# Patient Record
Sex: Female | Born: 1950 | Race: White | Hispanic: No | Marital: Married | Smoking: Never smoker
Health system: Southern US, Community
[De-identification: ages and names within clinical notes are randomized; demographics above are authoritative.]

## PROBLEM LIST (undated history)

## (undated) DIAGNOSIS — N6019 Diffuse cystic mastopathy of unspecified breast: Secondary | ICD-10-CM

## (undated) DIAGNOSIS — M8580 Other specified disorders of bone density and structure, unspecified site: Secondary | ICD-10-CM

## (undated) DIAGNOSIS — G43909 Migraine, unspecified, not intractable, without status migrainosus: Secondary | ICD-10-CM

## (undated) DIAGNOSIS — D473 Essential (hemorrhagic) thrombocythemia: Secondary | ICD-10-CM

## (undated) DIAGNOSIS — D219 Benign neoplasm of connective and other soft tissue, unspecified: Secondary | ICD-10-CM

## (undated) DIAGNOSIS — I10 Essential (primary) hypertension: Secondary | ICD-10-CM

## (undated) HISTORY — DX: Other specified disorders of bone density and structure, unspecified site: M85.80

## (undated) HISTORY — DX: Migraine, unspecified, not intractable, without status migrainosus: G43.909

## (undated) HISTORY — DX: Diffuse cystic mastopathy of unspecified breast: N60.19

## (undated) HISTORY — DX: Essential (primary) hypertension: I10

## (undated) HISTORY — PX: BREAST SURGERY: SHX581

## (undated) HISTORY — DX: Benign neoplasm of connective and other soft tissue, unspecified: D21.9

## (undated) HISTORY — DX: Essential (hemorrhagic) thrombocythemia: D47.3

---

## 1998-03-22 ENCOUNTER — Ambulatory Visit (HOSPITAL_COMMUNITY): Admission: RE | Admit: 1998-03-22 | Discharge: 1998-03-22 | Payer: Self-pay | Admitting: Gastroenterology

## 2000-06-06 ENCOUNTER — Other Ambulatory Visit: Admission: RE | Admit: 2000-06-06 | Discharge: 2000-06-06 | Payer: Self-pay

## 2000-07-30 ENCOUNTER — Other Ambulatory Visit: Admission: RE | Admit: 2000-07-30 | Discharge: 2000-07-30 | Payer: Self-pay | Admitting: General Surgery

## 2000-11-24 ENCOUNTER — Encounter: Payer: Self-pay | Admitting: General Surgery

## 2000-11-24 ENCOUNTER — Encounter: Admission: RE | Admit: 2000-11-24 | Discharge: 2000-11-24 | Payer: Self-pay | Admitting: General Surgery

## 2000-11-25 ENCOUNTER — Encounter (INDEPENDENT_AMBULATORY_CARE_PROVIDER_SITE_OTHER): Payer: Self-pay | Admitting: Specialist

## 2000-11-25 ENCOUNTER — Ambulatory Visit (HOSPITAL_BASED_OUTPATIENT_CLINIC_OR_DEPARTMENT_OTHER): Admission: RE | Admit: 2000-11-25 | Discharge: 2000-11-25 | Payer: Self-pay | Admitting: General Surgery

## 2003-01-20 ENCOUNTER — Other Ambulatory Visit: Admission: RE | Admit: 2003-01-20 | Discharge: 2003-01-20 | Payer: Self-pay | Admitting: Radiology

## 2003-06-16 ENCOUNTER — Other Ambulatory Visit: Admission: RE | Admit: 2003-06-16 | Discharge: 2003-06-16 | Payer: Self-pay | Admitting: Obstetrics and Gynecology

## 2003-11-02 ENCOUNTER — Ambulatory Visit (HOSPITAL_COMMUNITY): Admission: RE | Admit: 2003-11-02 | Discharge: 2003-11-02 | Payer: Self-pay | Admitting: Gastroenterology

## 2004-11-07 ENCOUNTER — Other Ambulatory Visit: Admission: RE | Admit: 2004-11-07 | Discharge: 2004-11-07 | Payer: Self-pay | Admitting: Internal Medicine

## 2007-02-16 ENCOUNTER — Other Ambulatory Visit: Admission: RE | Admit: 2007-02-16 | Discharge: 2007-02-16 | Payer: Self-pay | Admitting: Internal Medicine

## 2009-02-27 ENCOUNTER — Other Ambulatory Visit: Admission: RE | Admit: 2009-02-27 | Discharge: 2009-02-27 | Payer: Self-pay | Admitting: Internal Medicine

## 2010-03-02 ENCOUNTER — Ambulatory Visit: Payer: Self-pay | Admitting: Oncology

## 2010-03-06 LAB — COMPREHENSIVE METABOLIC PANEL
ALT: 22 U/L (ref 0–35)
AST: 25 U/L (ref 0–37)
Albumin: 4.7 g/dL (ref 3.5–5.2)
Alkaline Phosphatase: 60 U/L (ref 39–117)
BUN: 20 mg/dL (ref 6–23)
CO2: 24 mEq/L (ref 19–32)
Calcium: 9.8 mg/dL (ref 8.4–10.5)
Chloride: 103 mEq/L (ref 96–112)
Creatinine, Ser: 0.97 mg/dL (ref 0.40–1.20)
Glucose, Bld: 98 mg/dL (ref 70–99)
Potassium: 4.7 mEq/L (ref 3.5–5.3)
Sodium: 141 mEq/L (ref 135–145)
Total Bilirubin: 0.5 mg/dL (ref 0.3–1.2)
Total Protein: 7.1 g/dL (ref 6.0–8.3)

## 2010-03-06 LAB — CBC WITH DIFFERENTIAL/PLATELET
BASO%: 0.4 % (ref 0.0–2.0)
Basophils Absolute: 0 10*3/uL (ref 0.0–0.1)
EOS%: 1.7 % (ref 0.0–7.0)
Eosinophils Absolute: 0.1 10*3/uL (ref 0.0–0.5)
HCT: 39.6 % (ref 34.8–46.6)
HGB: 13.8 g/dL (ref 11.6–15.9)
LYMPH%: 22.4 % (ref 14.0–49.7)
MCH: 32.2 pg (ref 25.1–34.0)
MCHC: 34.9 g/dL (ref 31.5–36.0)
MCV: 92.3 fL (ref 79.5–101.0)
MONO#: 0.4 10*3/uL (ref 0.1–0.9)
MONO%: 9.2 % (ref 0.0–14.0)
NEUT#: 3.2 10*3/uL (ref 1.5–6.5)
NEUT%: 66.3 % (ref 38.4–76.8)
Platelets: 99 10*3/uL — ABNORMAL LOW (ref 145–400)
RBC: 4.29 10*6/uL (ref 3.70–5.45)
RDW: 12.8 % (ref 11.2–14.5)
WBC: 4.8 10*3/uL (ref 3.9–10.3)
lymph#: 1.1 10*3/uL (ref 0.9–3.3)

## 2010-03-06 LAB — CHCC SMEAR

## 2010-03-06 LAB — LACTATE DEHYDROGENASE: LDH: 193 U/L (ref 94–250)

## 2010-03-08 ENCOUNTER — Ambulatory Visit (HOSPITAL_COMMUNITY): Admission: RE | Admit: 2010-03-08 | Discharge: 2010-03-08 | Payer: Self-pay | Admitting: Oncology

## 2010-07-10 ENCOUNTER — Ambulatory Visit: Payer: Self-pay | Admitting: Oncology

## 2010-07-12 LAB — CBC WITH DIFFERENTIAL/PLATELET
BASO%: 0.6 % (ref 0.0–2.0)
Basophils Absolute: 0 10*3/uL (ref 0.0–0.1)
EOS%: 3.9 % (ref 0.0–7.0)
Eosinophils Absolute: 0.2 10*3/uL (ref 0.0–0.5)
HCT: 36.5 % (ref 34.8–46.6)
HGB: 12.6 g/dL (ref 11.6–15.9)
LYMPH%: 23.4 % (ref 14.0–49.7)
MCH: 32 pg (ref 25.1–34.0)
MCHC: 34.6 g/dL (ref 31.5–36.0)
MCV: 92.2 fL (ref 79.5–101.0)
MONO#: 0.5 10*3/uL (ref 0.1–0.9)
MONO%: 9.9 % (ref 0.0–14.0)
NEUT#: 3.1 10*3/uL (ref 1.5–6.5)
NEUT%: 62.2 % (ref 38.4–76.8)
Platelets: 114 10*3/uL — ABNORMAL LOW (ref 145–400)
RBC: 3.96 10*6/uL (ref 3.70–5.45)
RDW: 12.6 % (ref 11.2–14.5)
WBC: 4.9 10*3/uL (ref 3.9–10.3)
lymph#: 1.2 10*3/uL (ref 0.9–3.3)

## 2011-01-10 ENCOUNTER — Encounter (HOSPITAL_BASED_OUTPATIENT_CLINIC_OR_DEPARTMENT_OTHER): Payer: BC Managed Care – PPO | Admitting: Oncology

## 2011-01-10 ENCOUNTER — Other Ambulatory Visit: Payer: Self-pay | Admitting: Oncology

## 2011-01-10 DIAGNOSIS — D696 Thrombocytopenia, unspecified: Secondary | ICD-10-CM

## 2011-01-10 DIAGNOSIS — D6959 Other secondary thrombocytopenia: Secondary | ICD-10-CM

## 2011-01-10 LAB — CBC WITH DIFFERENTIAL/PLATELET
BASO%: 0.4 % (ref 0.0–2.0)
Basophils Absolute: 0 10*3/uL (ref 0.0–0.1)
EOS%: 2.1 % (ref 0.0–7.0)
Eosinophils Absolute: 0.1 10*3/uL (ref 0.0–0.5)
HCT: 38.7 % (ref 34.8–46.6)
HGB: 13 g/dL (ref 11.6–15.9)
LYMPH%: 23.4 % (ref 14.0–49.7)
MCH: 31.2 pg (ref 25.1–34.0)
MCHC: 33.7 g/dL (ref 31.5–36.0)
MCV: 92.7 fL (ref 79.5–101.0)
MONO#: 0.4 10*3/uL (ref 0.1–0.9)
MONO%: 8.2 % (ref 0.0–14.0)
NEUT#: 3.5 10*3/uL (ref 1.5–6.5)
NEUT%: 65.9 % (ref 38.4–76.8)
Platelets: 125 10*3/uL — ABNORMAL LOW (ref 145–400)
RBC: 4.17 10*6/uL (ref 3.70–5.45)
RDW: 12.4 % (ref 11.2–14.5)
WBC: 5.4 10*3/uL (ref 3.9–10.3)
lymph#: 1.3 10*3/uL (ref 0.9–3.3)

## 2011-02-01 NOTE — Op Note (Signed)
[name], Cynthia Lin                        ACCOUNT NO.:  [id number]   MEDICAL RECORD NO.:  [id number]                   PATIENT TYPE:  AMB   LOCATION:  ENDO                                 FACILITY:  [facility]   PHYSICIAN:  Anselmo Rod, M.D.               DATE OF BIRTH:  [birth year]   DATE OF PROCEDURE:  11/02/2003  DATE OF DISCHARGE:                                 OPERATIVE REPORT   PROCEDURE PERFORMED:  Screening colonoscopy.   ENDOSCOPIST:  Charna Elizabeth, M.D.   INSTRUMENT USED:  Olympus video colonoscope.   INDICATIONS FOR PROCEDURE:  The patient is a 60 year old white female  undergoing screening colonoscopy for a family history of colon cancer.  Rule  out colonic polyps, masses, etc.   PREPROCEDURE PREPARATION:  Informed consent was procured from the patient.  The patient was fasted for eight hours prior to the procedure and prepped  with a bottle of magnesium citrate and a gallon of GoLYTELY the night prior  to the procedure.   PREPROCEDURE PHYSICAL:  The patient had stable vital signs.  Neck supple.  Chest clear to auscultation.  S1 and S2 regular.  Abdomen soft with normal  bowel sounds.   DESCRIPTION OF PROCEDURE:  The patient was placed in left lateral decubitus  position and sedated with 25 mg of Demerol and 2.5 mg of Versed  intravenously.  Once the patient was adequately sedated and maintained on  low flow oxygen and continuous cardiac monitoring, the Olympus video  colonoscope was advanced from the rectum to the cecum.  The appendicular  orifice and ileocecal valve were clearly visualized and photographed.  No  masses, polyps, erosions, ulcerations or diverticula were seen.  Retroflexion in the rectum revealed small nonbleeding internal hemorrhoids.  The patient tolerated the procedure well without complications.   IMPRESSION:  Essentially normal colonoscopy up to the cecum except for small  internal hemorrhoids.   RECOMMENDATIONS:  1. Repeat colorectal  cancer screening is recommended in the next five years     unless the patient develops any     abnormal symptoms in the interim.  2. Outpatient followup as need arises in the future.  3. Continue high fiber diet with liberal fluid intake.                                               Anselmo Rod, M.D.    JNM/MEDQ  D:  11/02/2003  T:  11/02/2003  Job:  [id number]   cc:   Lovenia Kim, D.O.  [address]  [place]  [place] [address]  Fax: [phone]

## 2011-02-01 NOTE — Op Note (Signed)
[facility]  Patient:    Cynthia Lin, Cynthia Lin                     MRN: [id number] Proc. Date: 11/25/00 Adm. Date:  [id number] Attending:  Arlis Porta CC:         Ammie Dalton, M.D.                           Operative Report  PREOPERATIVE DIAGNOSIS:  Left breast mass.  POSTOPERATIVE DIAGNOSIS:  Left breast mass.  OPERATION PERFORMED:  Left breast biopsy.  SURGEON:  Adolph Pollack, M.D.  ASSISTANT:  Gwenlyn Perking, M.D.  ANESTHESIA:  General.  INDICATIONS FOR PROCEDURE:  The patient is a 60 year old female whom we first saw July 30, 2000.  At this time she had been treated for mastitis.  She was noted to have numerous dilated ducts without any intraductal masses noted on ultrasound.  She also had some bloody nipple discharge.  When I first saw her, she appeared to have some residual mastitis.  She also had a palpable irregular breast mass in the upper outer quadrant and when pressure was placedon this mass, bloody nipple discharge was noted.  She then underwent a ductogram which demonstrated some dilated ducts but did not demonstrate any filling defects of findings consistent with an intraductal papilloma or mass. After the ductogram had been performed, she came back for follow-up.  We had talked about biopsy or close follow-up and had concurred on the latter plan. After close follow-up she had the persistent left breast mass and I advised her to undergo biopsy and she presents for that.  The procedure and risks including but not limited to bleeding and infection were explained to her preoperatively.  DESCRIPTION OF PROCEDURE:  She was placed supine on the operating table.  The left breast mass in the upper outer quadrant was palpated and marked.  She was then given intravenous sedation.  The left breast was sterilely prepped and draped.  Local anesthetic was infiltrated both superficially and deep in a curvilinear  fashion from approximately the 12 oclock position to the 3 oclock position.  A curvilinear incision was made in the upper outer quadrant of the left breast incising the skin sharply.  Skin flaps were raised in all directions.  I could palpate the irregularity and it was excised sharply.  As I was excising it, there  were multiple pockets of cloudy fluid.  I suspected these were the ectatic ducts that the ultrasound mentioned.  I marked the lesion with suture.  Specifically, one suture for the superior margin and two sutures for the medial margin.  It was then sent fresh to pathology.  I then inspected the biopsy bed and there were bleeding points and these were controlled with the cautery.  Once the hemostasis was adequate, I loosely approximated the subcutaneous tissue with interrupted 3-0 Vicryl suture and closed the skin with running 4-0 Monocryl subcuticular stitch.  Steri-Strip and sterile dressing were applied.  The patient tolerated the procedure well without any apparent complications and she was taken to the recovery room in satisfactory condition. DD:  11/25/00 TD:  11/25/00 Job: [id number]

## 2011-06-26 IMAGING — US US ABDOMEN LIMITED
1 series · 11 of 11 positions shown · non-contrast
Comparison: None

CLINICAL DATA: Evaluate spleen size.

LIMITED ABDOMINAL ULTRASOUND

[Series 1: us abdomen limited · 0.23mm/px · 11 of 11 slices shown]
[im 1/11]
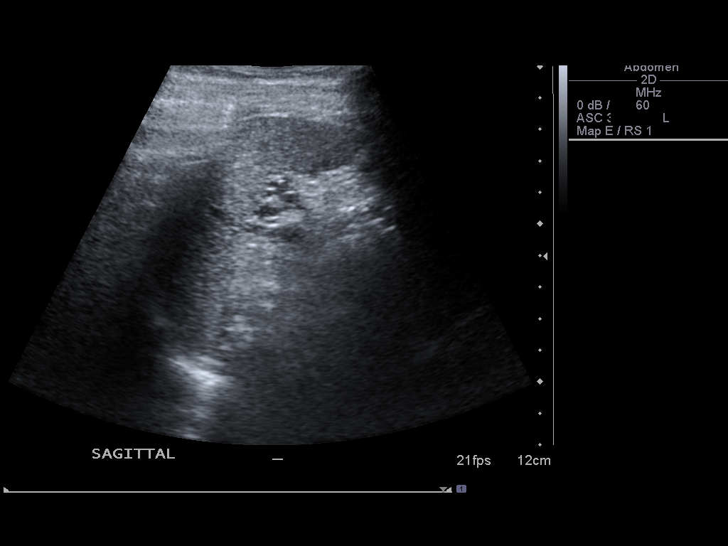
[im 2/11]
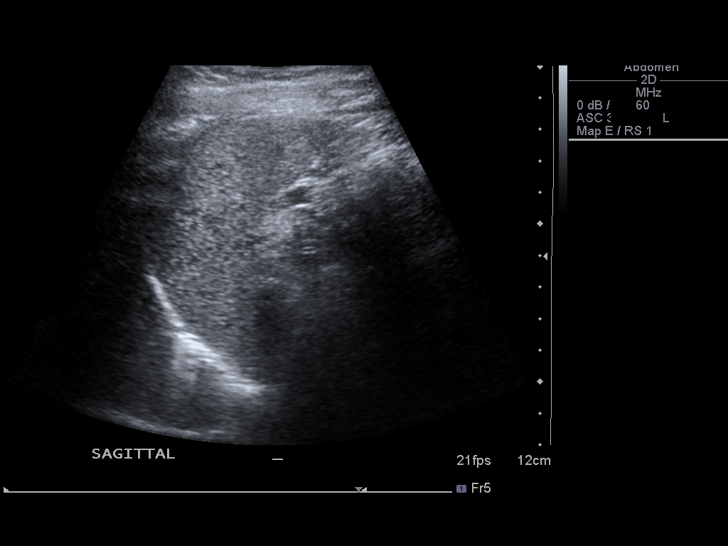
[im 3/11]
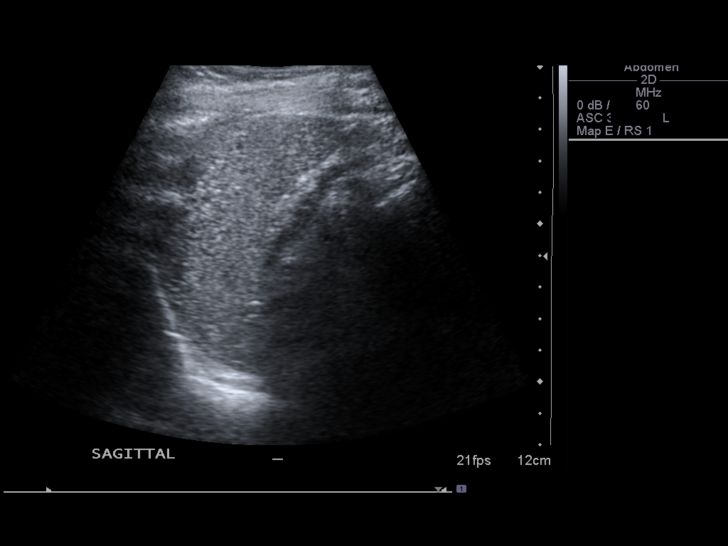
[im 4/11]
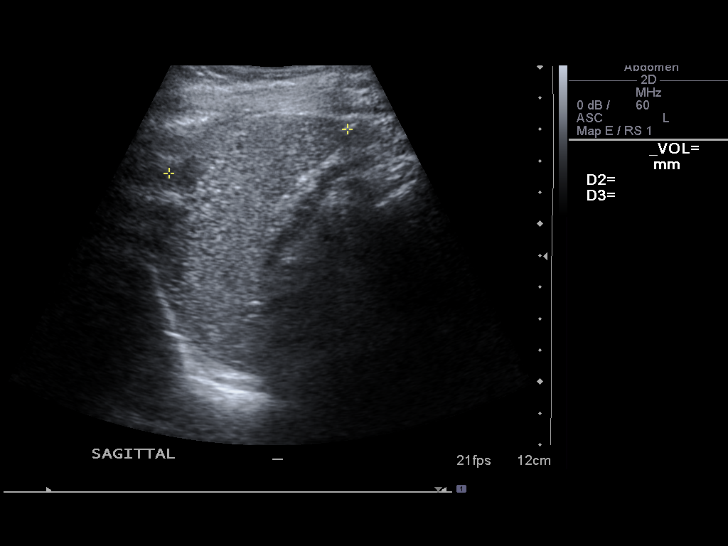
[im 5/11]
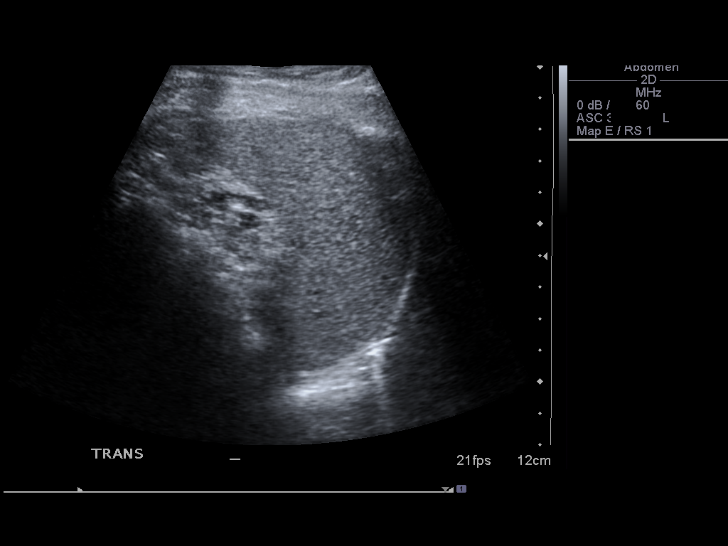
[im 6/11]
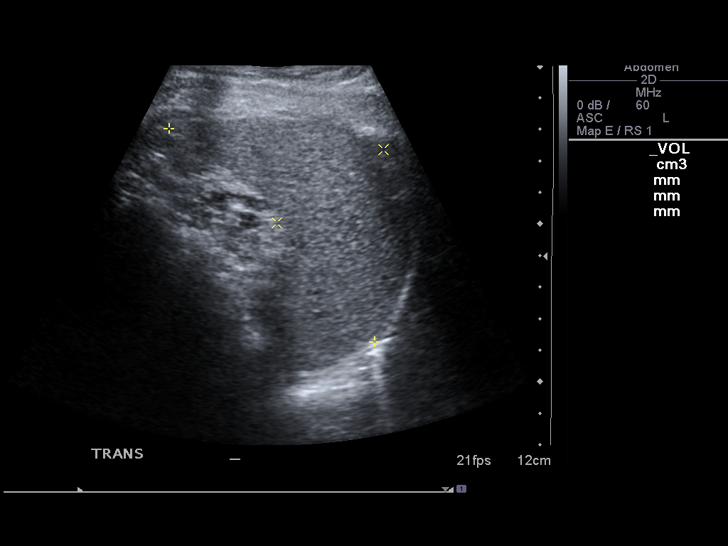
[im 7/11]
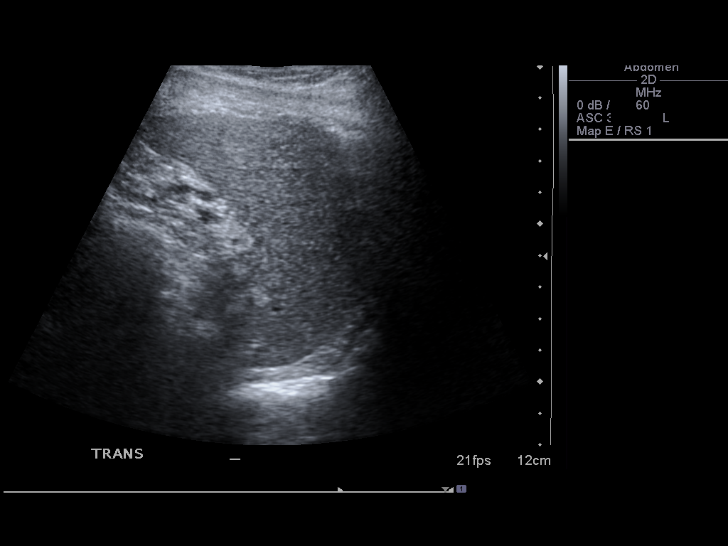
[im 8/11]
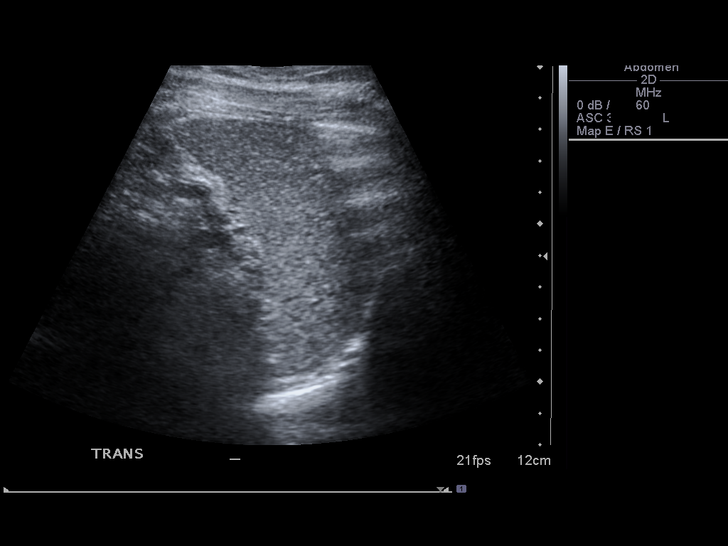
[im 9/11]
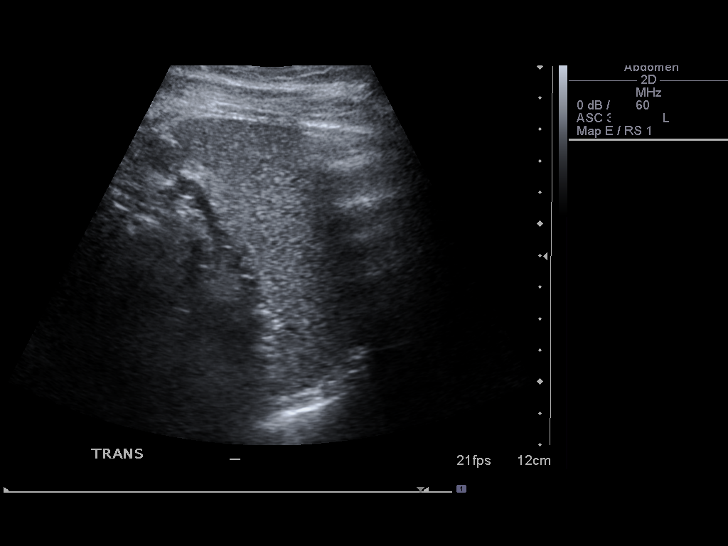
[im 10/11]
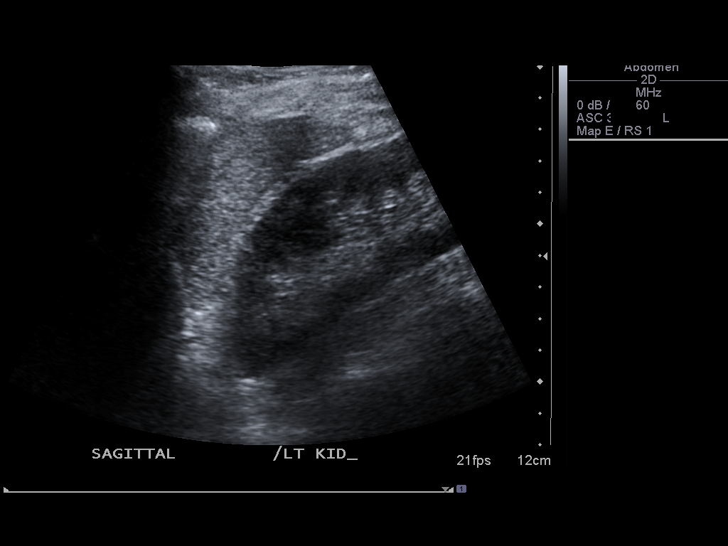
[im 11/11]
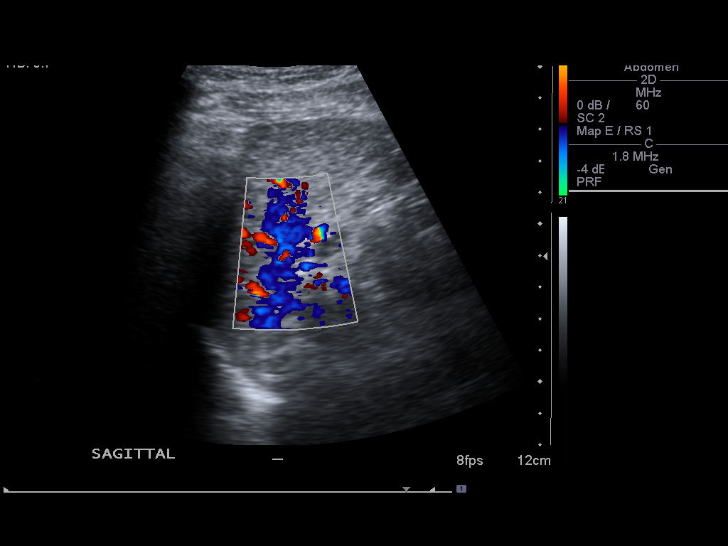

[11 of 11 positions shown; findings below may reference images not displayed]

FINDINGS: Spleen is normal size.  No focal abnormality.  Spleen
length is 5.8 cm with a volume of 117 ml.
IMPRESSION: Spleen unremarkable.

## 2011-07-29 ENCOUNTER — Ambulatory Visit (HOSPITAL_COMMUNITY)
Admission: RE | Admit: 2011-07-29 | Discharge: 2011-07-29 | Disposition: A | Discharge status: Home / Self Care | Payer: BC Managed Care – PPO | Source: Ambulatory Visit | Attending: Internal Medicine | Admitting: Internal Medicine

## 2011-07-29 ENCOUNTER — Other Ambulatory Visit (HOSPITAL_COMMUNITY): Payer: Self-pay | Admitting: Internal Medicine

## 2011-07-29 DIAGNOSIS — M25579 Pain in unspecified ankle and joints of unspecified foot: Secondary | ICD-10-CM

## 2011-07-29 DIAGNOSIS — S82899A Other fracture of unspecified lower leg, initial encounter for closed fracture: Secondary | ICD-10-CM | POA: Insufficient documentation

## 2011-08-02 ENCOUNTER — Encounter: Payer: Self-pay | Admitting: *Deleted

## 2011-08-20 ENCOUNTER — Telehealth: Payer: Self-pay | Admitting: Oncology

## 2011-08-20 NOTE — Telephone Encounter (Signed)
Pt called today to move 12/5 appt to 12/20 @ 3 :30 pm.

## 2011-08-21 ENCOUNTER — Ambulatory Visit: Payer: BC Managed Care – PPO | Admitting: Oncology

## 2011-08-21 ENCOUNTER — Other Ambulatory Visit: Payer: BC Managed Care – PPO

## 2011-09-05 ENCOUNTER — Other Ambulatory Visit: Payer: BC Managed Care – PPO

## 2011-09-05 ENCOUNTER — Ambulatory Visit: Payer: BC Managed Care – PPO | Admitting: Oncology

## 2012-01-09 ENCOUNTER — Other Ambulatory Visit: Payer: Self-pay

## 2012-03-24 ENCOUNTER — Other Ambulatory Visit (HOSPITAL_COMMUNITY)
Admission: RE | Admit: 2012-03-24 | Discharge: 2012-03-24 | Disposition: A | Discharge status: Home / Self Care | Payer: BC Managed Care – PPO | Source: Ambulatory Visit | Attending: Internal Medicine | Admitting: Internal Medicine

## 2012-03-24 ENCOUNTER — Other Ambulatory Visit: Payer: Self-pay

## 2012-03-24 DIAGNOSIS — Z01419 Encounter for gynecological examination (general) (routine) without abnormal findings: Secondary | ICD-10-CM | POA: Insufficient documentation

## 2012-05-12 ENCOUNTER — Encounter (INDEPENDENT_AMBULATORY_CARE_PROVIDER_SITE_OTHER): Payer: BC Managed Care – PPO | Admitting: General Surgery

## 2012-05-27 ENCOUNTER — Encounter (INDEPENDENT_AMBULATORY_CARE_PROVIDER_SITE_OTHER): Payer: BC Managed Care – PPO | Admitting: General Surgery

## 2012-06-08 ENCOUNTER — Ambulatory Visit (INDEPENDENT_AMBULATORY_CARE_PROVIDER_SITE_OTHER): Payer: BC Managed Care – PPO | Admitting: General Surgery

## 2012-06-08 ENCOUNTER — Encounter (INDEPENDENT_AMBULATORY_CARE_PROVIDER_SITE_OTHER): Payer: Self-pay | Admitting: General Surgery

## 2012-06-08 DIAGNOSIS — N6459 Other signs and symptoms in breast: Secondary | ICD-10-CM

## 2012-06-08 DIAGNOSIS — N6452 Nipple discharge: Secondary | ICD-10-CM

## 2012-06-08 NOTE — Progress Notes (Signed)
Patient ID: Cynthia Lin, female   DOB: 09/11/1951, 61 y.o.   MRN: [id number]  No chief complaint on file.   HPI Cynthia Lin is a 61 y.o. female.   HPI  She is referred by Dr. Oneta Rack for evaluation of bloody right nipple discharge.  It has happened twice.  Once spontaneously into her bra (dark bloody spot) and the second time during her annual physical exam in July.  It has not happened in the past two months.  She denies any trauma to the area.  She has not detected any new masses.  Past Medical History  Diagnosis Date  . Essential thrombocytosis   . Fibrocystic breast disease     Past Surgical History  Procedure Date  . Breast surgery     Left breast biopsy (benign)    History reviewed. No pertinent family history.  Social History History  Substance Use Topics  . Smoking status: Not on file  . Smokeless tobacco: Not on file  . Alcohol Use:     Allergies  Allergen Reactions  . Other     Cigarette smoke    Current Outpatient Prescriptions  Medication Sig Dispense Refill  . cholecalciferol (VITAMIN D) 400 UNITS TABS Take 400 Units by mouth daily.        . Multiple Vitamin (MULTIVITAMIN) capsule Take 1 capsule by mouth daily.          Review of Systems Review of Systems  Respiratory:       No breast pain.    There were no vitals taken for this visit.  Physical Exam Physical Exam  Constitutional: She appears well-developed and well-nourished. No distress.  HENT:  Head: Normocephalic and atraumatic.  Neck: Neck supple.  Pulmonary/Chest:       Right breast-no masses, no expressible nipple discharge, no suspicious skin lesions.  Left breast- lateral scar; no palpable masses or nipple discharge; no suspicious skin lesions.  Musculoskeletal:       No axillary adenopathy.  Lymphadenopathy:    She has no cervical adenopathy.    Data Reviewed Mammogram, ultrasound, Dr. Kathryne Sharper note  Assessment    Bloody right nipple discharge-none over the past  two months.  Unable to express any discharge today.      Plan    Return visit in 6-8 weeks.  If she starts having the discharge again, will refer to [facility] for a ductogram.       Chayse Gracey J 06/08/2012, 11:23 AM

## 2012-06-08 NOTE — Patient Instructions (Signed)
Call if you have recurrence of the discharge.

## 2012-07-10 ENCOUNTER — Encounter (INDEPENDENT_AMBULATORY_CARE_PROVIDER_SITE_OTHER): Payer: Self-pay

## 2012-07-30 ENCOUNTER — Encounter (INDEPENDENT_AMBULATORY_CARE_PROVIDER_SITE_OTHER): Payer: BC Managed Care – PPO | Admitting: General Surgery

## 2012-09-01 ENCOUNTER — Encounter (INDEPENDENT_AMBULATORY_CARE_PROVIDER_SITE_OTHER): Payer: Self-pay

## 2012-11-15 IMAGING — CR DG ANKLE COMPLETE 3+V*R*
3 series · 3 of 3 positions shown · non-contrast
Comparison: None.

CLINICAL DATA: Right lateral ankle pain, injury 3 weeks ago

RIGHT ANKLE - COMPLETE 3+ VIEW

[view not recorded (1 of 3)]
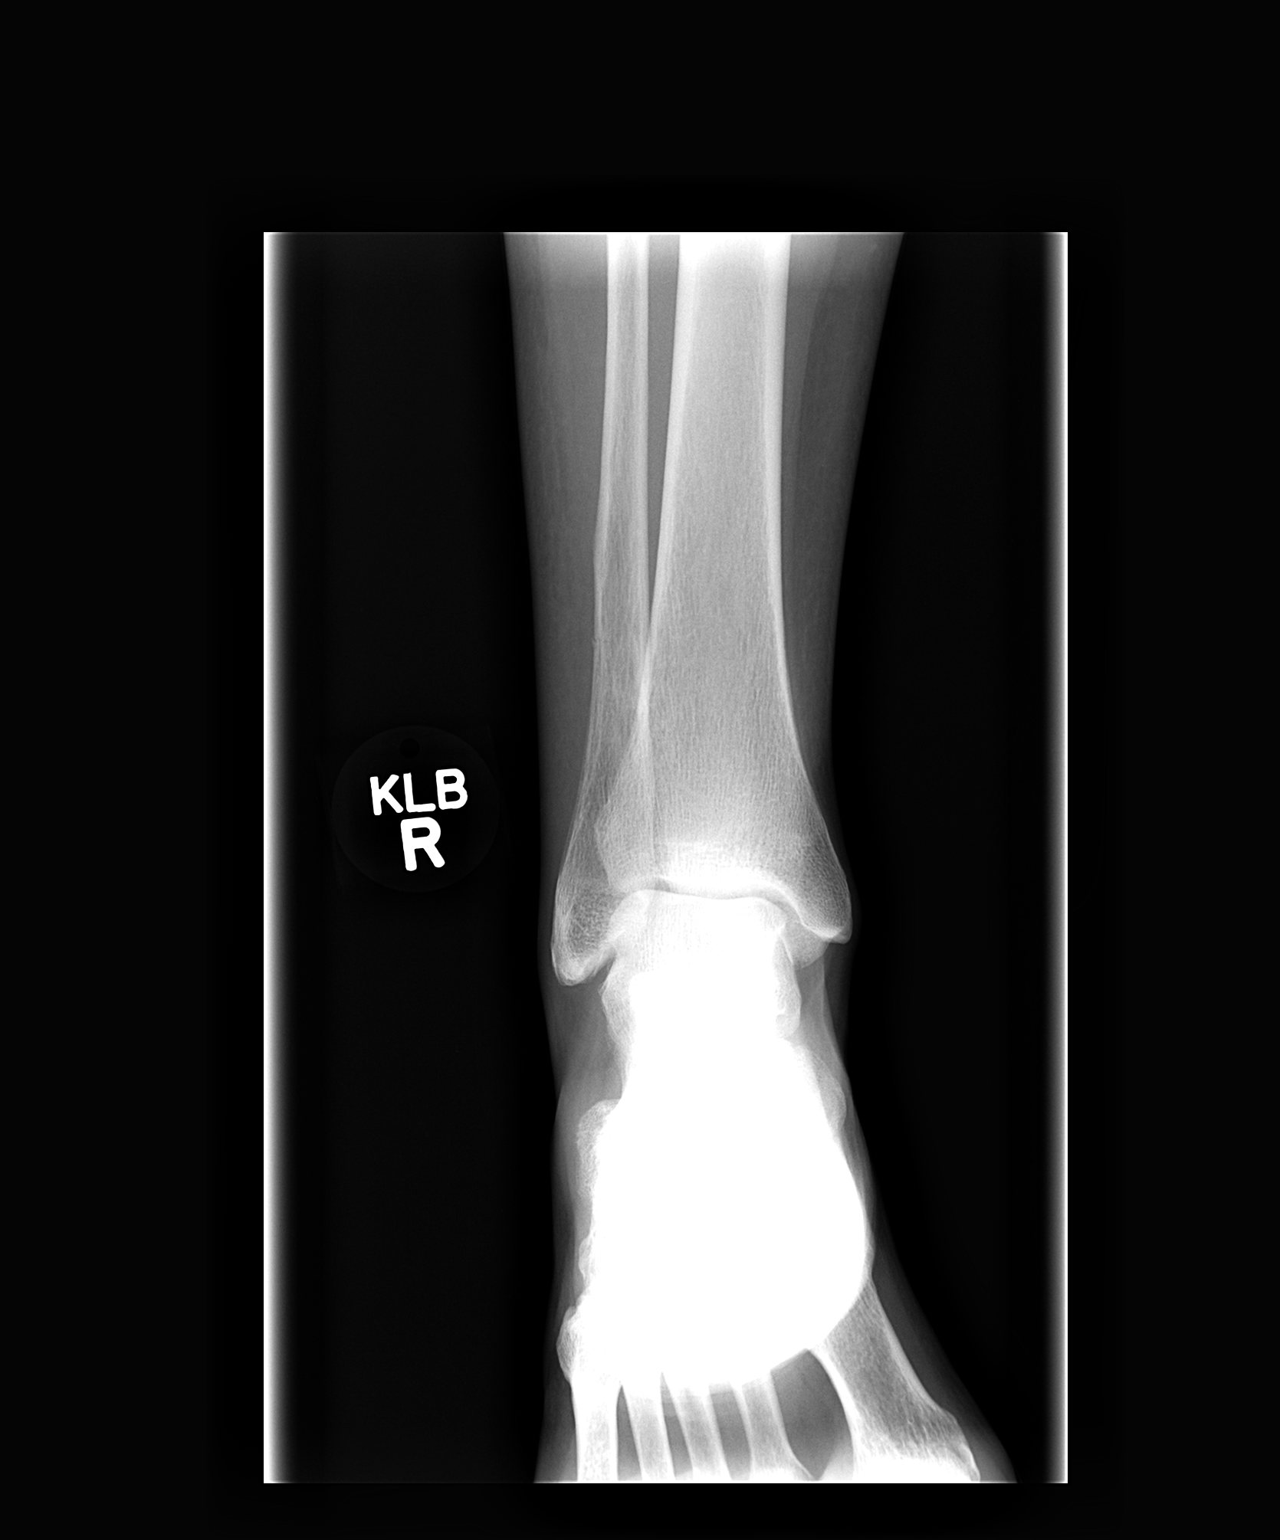

[view not recorded (2 of 3)]
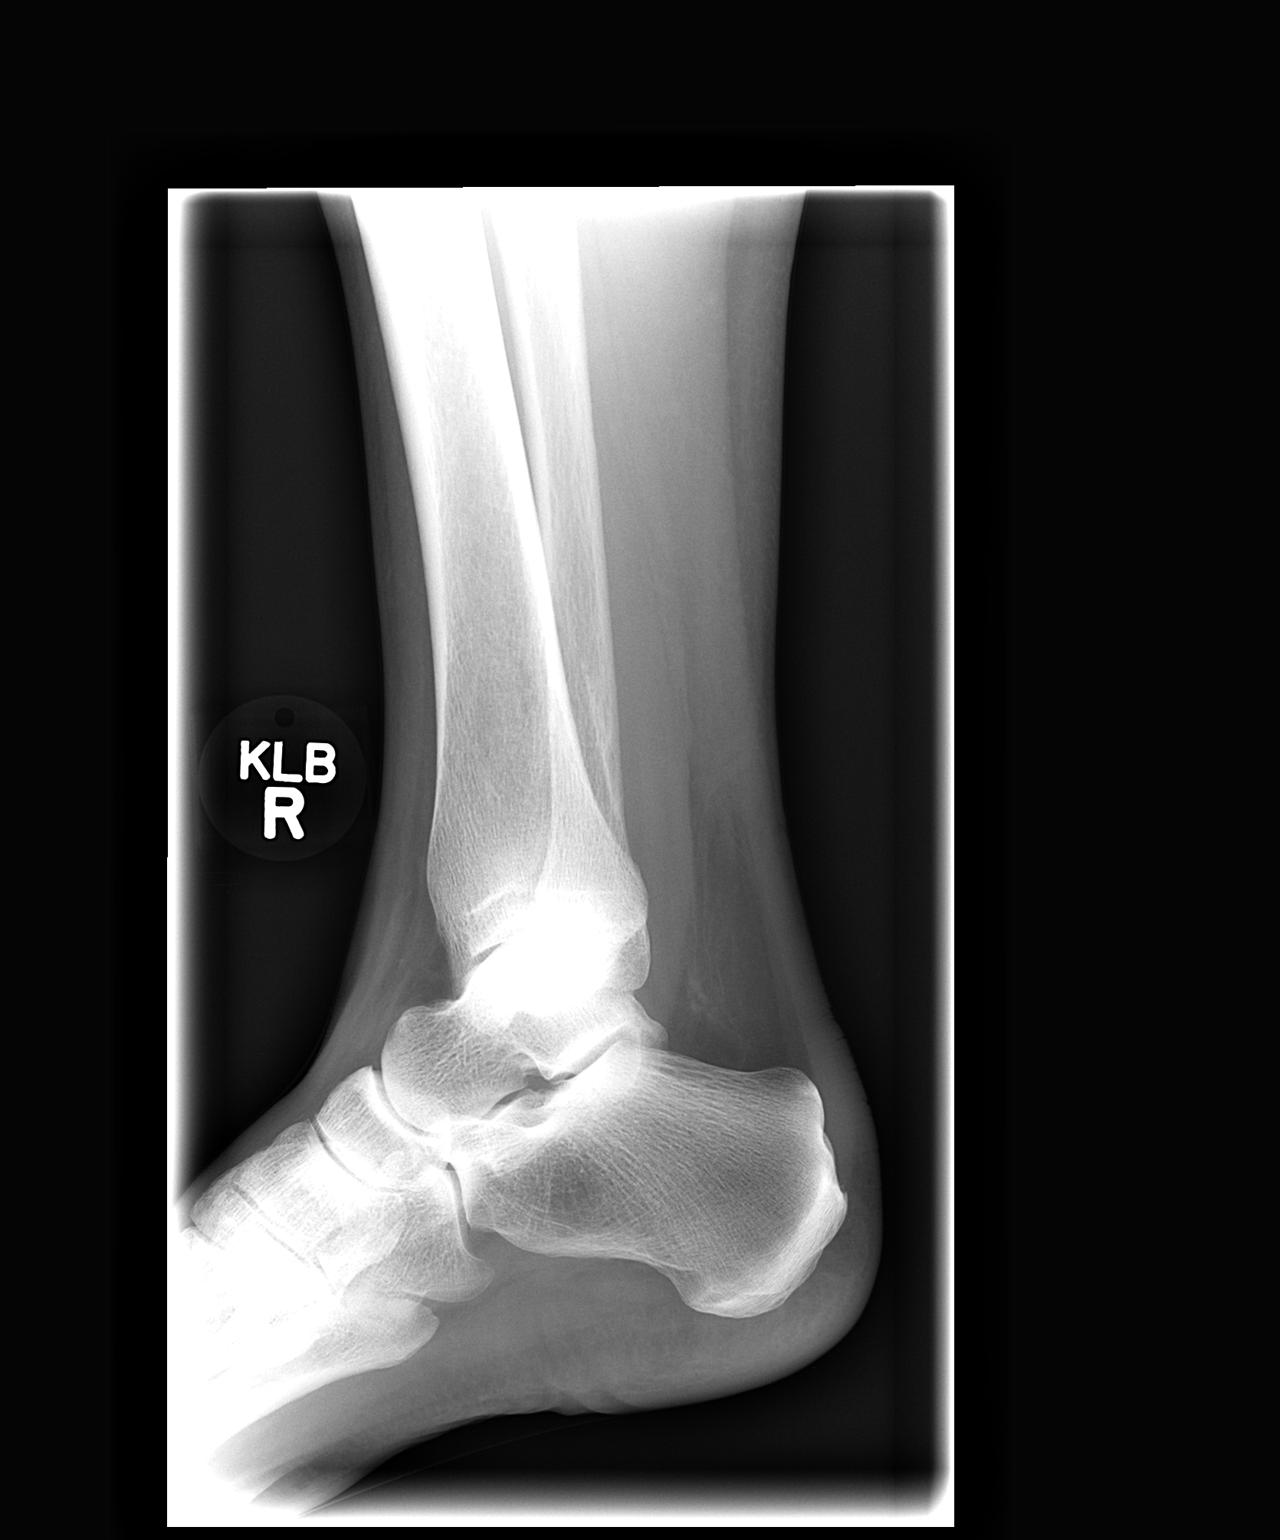

[view not recorded (3 of 3)]
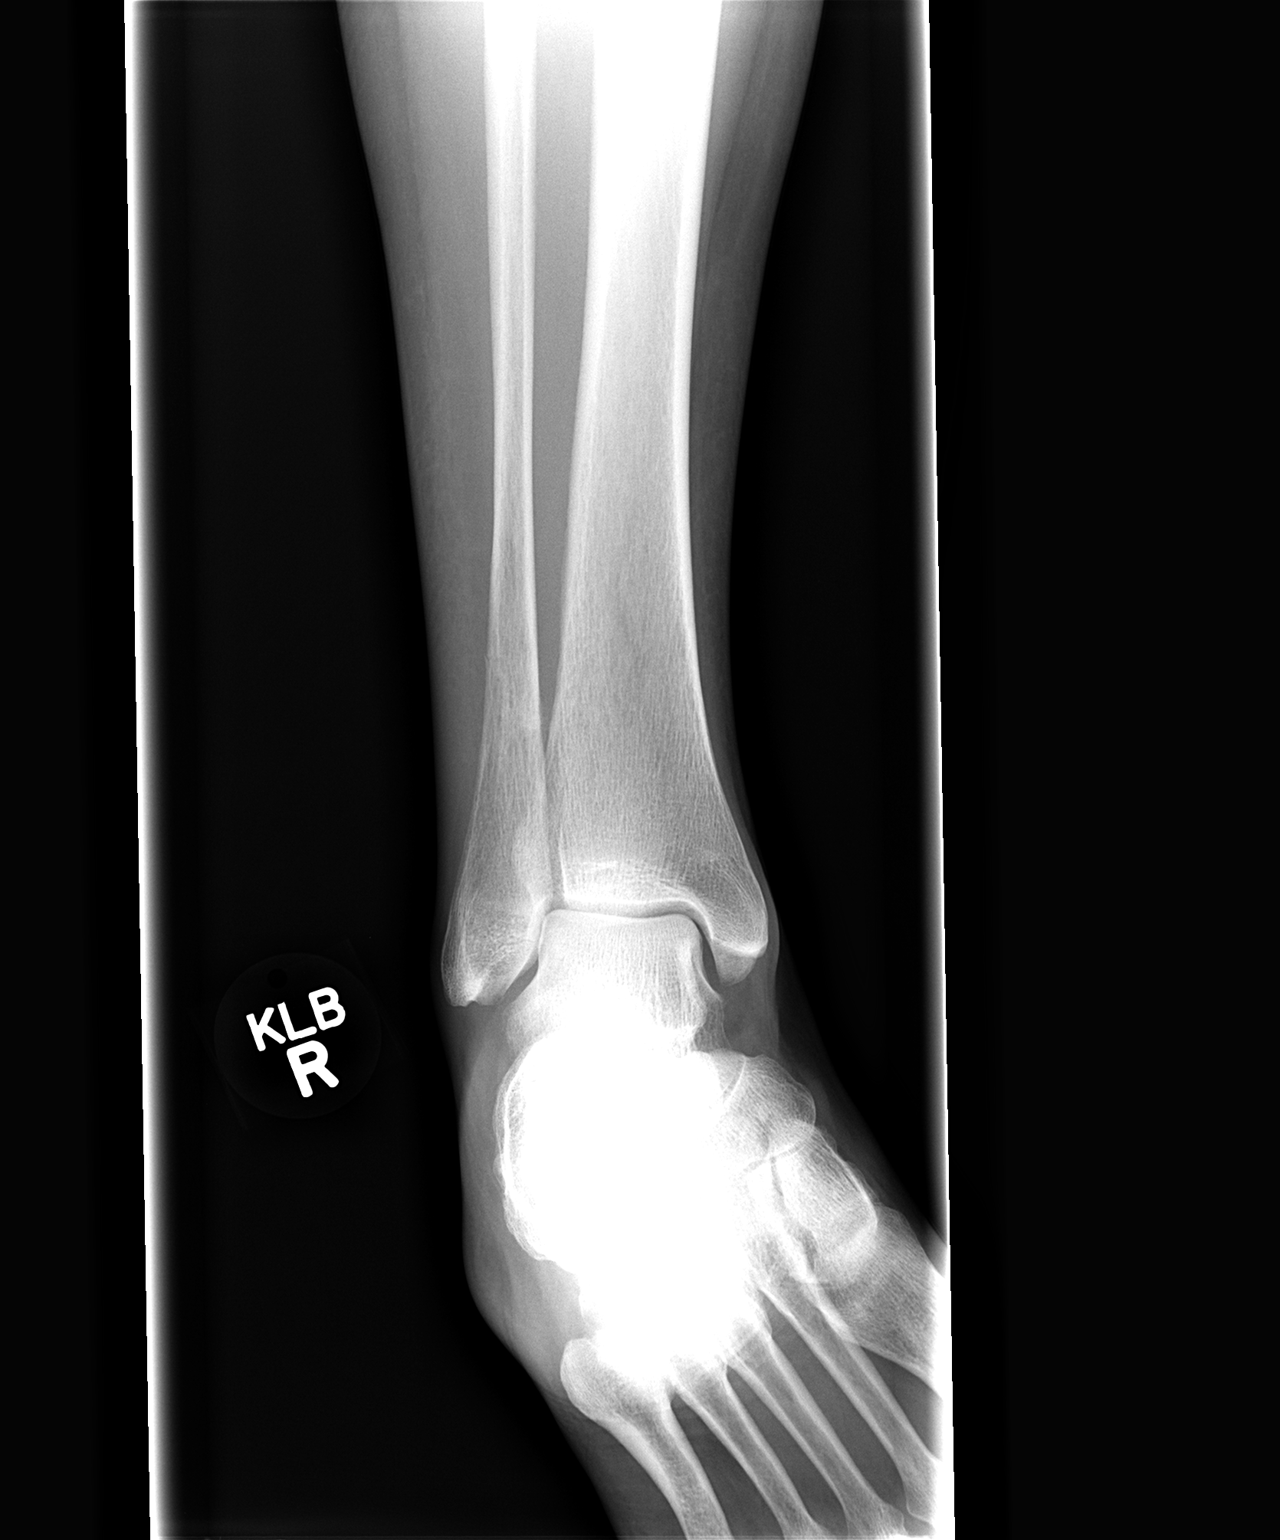

[3 of 3 positions shown; findings below may reference images not displayed]

FINDINGS: The mortise is symmetric.  There is an incomplete oblique
fracture of the distal right fibular metadiaphysis.  Mild overlying
soft tissue swelling is noted.  No radiopaque foreign body.
IMPRESSION: Incomplete oblique fracture of the distal right fibular
metadiaphysis.

## 2014-03-20 DIAGNOSIS — I10 Essential (primary) hypertension: Secondary | ICD-10-CM | POA: Insufficient documentation

## 2014-03-20 DIAGNOSIS — M810 Age-related osteoporosis without current pathological fracture: Secondary | ICD-10-CM | POA: Insufficient documentation

## 2014-03-20 DIAGNOSIS — G43909 Migraine, unspecified, not intractable, without status migrainosus: Secondary | ICD-10-CM | POA: Insufficient documentation

## 2014-03-20 DIAGNOSIS — M8580 Other specified disorders of bone density and structure, unspecified site: Secondary | ICD-10-CM | POA: Insufficient documentation

## 2014-03-20 DIAGNOSIS — D219 Benign neoplasm of connective and other soft tissue, unspecified: Secondary | ICD-10-CM | POA: Insufficient documentation

## 2014-03-24 ENCOUNTER — Encounter: Payer: Self-pay | Admitting: Physician Assistant

## 2014-03-24 ENCOUNTER — Ambulatory Visit (INDEPENDENT_AMBULATORY_CARE_PROVIDER_SITE_OTHER): Payer: BC Managed Care – PPO | Admitting: Physician Assistant

## 2014-03-24 VITALS — BP 120/68 | HR 76 | Temp 98.2°F | Resp 16 | Ht 68.0 in | Wt 144.0 lb

## 2014-03-24 DIAGNOSIS — I10 Essential (primary) hypertension: Secondary | ICD-10-CM

## 2014-03-24 DIAGNOSIS — Z79899 Other long term (current) drug therapy: Secondary | ICD-10-CM

## 2014-03-24 DIAGNOSIS — Z0000 Encounter for general adult medical examination without abnormal findings: Secondary | ICD-10-CM

## 2014-03-24 DIAGNOSIS — E559 Vitamin D deficiency, unspecified: Secondary | ICD-10-CM

## 2014-03-24 LAB — CBC WITH DIFFERENTIAL/PLATELET
Basophils Absolute: 0 10*3/uL (ref 0.0–0.1)
Basophils Relative: 1 % (ref 0–1)
Eosinophils Absolute: 0.1 10*3/uL (ref 0.0–0.7)
Eosinophils Relative: 2 % (ref 0–5)
HCT: 39.5 % (ref 36.0–46.0)
Hemoglobin: 13.5 g/dL (ref 12.0–15.0)
Lymphocytes Relative: 34 % (ref 12–46)
Lymphs Abs: 1.5 10*3/uL (ref 0.7–4.0)
MCH: 31.3 pg (ref 26.0–34.0)
MCHC: 34.2 g/dL (ref 30.0–36.0)
MCV: 91.6 fL (ref 78.0–100.0)
Monocytes Absolute: 0.4 10*3/uL (ref 0.1–1.0)
Monocytes Relative: 9 % (ref 3–12)
Neutro Abs: 2.4 10*3/uL (ref 1.7–7.7)
Neutrophils Relative %: 54 % (ref 43–77)
Platelets: 109 10*3/uL — ABNORMAL LOW (ref 150–400)
RBC: 4.31 MIL/uL (ref 3.87–5.11)
RDW: 12.9 % (ref 11.5–15.5)
WBC: 4.5 10*3/uL (ref 4.0–10.5)

## 2014-03-24 LAB — HEMOGLOBIN A1C
Hgb A1c MFr Bld: 5.5 % (ref <5.7)
Mean Plasma Glucose: 111 mg/dL (ref <117)

## 2014-03-24 LAB — BASIC METABOLIC PANEL WITH GFR
BUN: 20 mg/dL (ref 6–23)
CO2: 30 mEq/L (ref 19–32)
Calcium: 10.1 mg/dL (ref 8.4–10.5)
Chloride: 104 mEq/L (ref 96–112)
Creat: 0.93 mg/dL (ref 0.50–1.10)
GFR, Est African American: 77 mL/min
GFR, Est Non African American: 67 mL/min
Glucose, Bld: 95 mg/dL (ref 70–99)
Potassium: 5.4 mEq/L — ABNORMAL HIGH (ref 3.5–5.3)
Sodium: 141 mEq/L (ref 135–145)

## 2014-03-24 LAB — HEPATIC FUNCTION PANEL
ALT: 24 U/L (ref 0–35)
AST: 26 U/L (ref 0–37)
Albumin: 4.4 g/dL (ref 3.5–5.2)
Alkaline Phosphatase: 68 U/L (ref 39–117)
Bilirubin, Direct: 0.1 mg/dL (ref 0.0–0.3)
Indirect Bilirubin: 0.4 mg/dL (ref 0.2–1.2)
Total Bilirubin: 0.5 mg/dL (ref 0.2–1.2)
Total Protein: 7 g/dL (ref 6.0–8.3)

## 2014-03-24 LAB — VITAMIN B12: Vitamin B-12: 688 pg/mL (ref 211–911)

## 2014-03-24 LAB — TSH: TSH: 0.613 u[IU]/mL (ref 0.350–4.500)

## 2014-03-24 LAB — LIPID PANEL
Cholesterol: 175 mg/dL (ref 0–200)
HDL: 95 mg/dL (ref >39)
LDL Cholesterol: 68 mg/dL (ref 0–99)
Total CHOL/HDL Ratio: 1.8 Ratio
Triglycerides: 58 mg/dL (ref <150)
VLDL: 12 mg/dL (ref 0–40)

## 2014-03-24 LAB — FERRITIN: Ferritin: 147 ng/mL (ref 10–291)

## 2014-03-24 LAB — IRON AND TIBC
%SAT: 32 % (ref 20–55)
Iron: 108 ug/dL (ref 42–145)
TIBC: 336 ug/dL (ref 250–470)
UIBC: 228 ug/dL (ref 125–400)

## 2014-03-24 LAB — MAGNESIUM: Magnesium: 2.2 mg/dL (ref 1.5–2.5)

## 2014-03-24 NOTE — Patient Instructions (Signed)
Dr. Collene Mares, GI Phone: [phone];   Preventative Care for Adults - Female      [place]:  A routine yearly physical is a good way to check in with your primary care provider about your health and preventive screening. It is also an opportunity to share updates about your health and any concerns you have, and receive a thorough all-over exam.   Most health insurance companies pay for at least some preventative services.  Check with your health plan for specific coverages.  WHAT PREVENTATIVE SERVICES DO WOMEN NEED?  Adult women should have their weight and blood pressure checked regularly.   Women age 67 and older should have their cholesterol levels checked regularly.  Women should be screened for cervical cancer with a Pap smear and pelvic exam beginning at either age 12, or 3 years after they become sexually activity.    Breast cancer screening generally begins at age 45 with a mammogram and breast exam by your primary care provider.    Beginning at age 44 and continuing to age 73, women should be screened for colorectal cancer.  Certain people may need continued testing until age 2.  Updating vaccinations is part of preventative care.  Vaccinations help protect against diseases such as the flu.  Osteoporosis is a disease in which the bones lose minerals and strength as we age. Women ages 30 and over should discuss this with their caregivers, as should women after menopause who have other risk factors.  Lab tests are generally done as part of preventative care to screen for anemia and blood disorders, to screen for problems with the kidneys and liver, to screen for bladder problems, to check blood sugar, and to check your cholesterol level.  Preventative services generally include counseling about diet, exercise, avoiding tobacco, drugs, excessive alcohol consumption, and sexually transmitted infections.    GENERAL RECOMMENDATIONS FOR GOOD HEALTH:  Healthy  diet:  Eat a variety of foods, including fruit, vegetables, animal or vegetable protein, such as meat, fish, chicken, and eggs, or beans, lentils, tofu, and grains, such as rice.  Drink plenty of water daily.  Decrease saturated fat in the diet, avoid lots of red meat, processed foods, sweets, fast foods, and fried foods.  Exercise:  Aerobic exercise helps maintain good heart health. At least 30-40 minutes of moderate-intensity exercise is recommended. For example, a brisk walk that increases your heart rate and breathing. This should be done on most days of the week.   Find a type of exercise or a variety of exercises that you enjoy so that it becomes a part of your daily life.  Examples are running, walking, swimming, water aerobics, and biking.  For motivation and support, explore group exercise such as aerobic class, spin class, Zumba, Yoga,or  martial arts, etc.    Set exercise goals for yourself, such as a certain weight goal, walk or run in a race such as a 5k walk/run.  Speak to your primary care provider about exercise goals.  Disease prevention:  If you smoke or chew tobacco, find out from your caregiver how to quit. It can literally save your life, no matter how long you have been a tobacco user. If you do not use tobacco, never begin.   Maintain a healthy diet and normal weight. Increased weight leads to problems with blood pressure and diabetes.   The Body Mass Index or BMI is a way of measuring how much of your body is fat. Having a BMI above  27 increases the risk of heart disease, diabetes, hypertension, stroke and other problems related to obesity. Your caregiver can help determine your BMI and based on it develop an exercise and dietary program to help you achieve or maintain this important measurement at a healthful level.  High blood pressure causes heart and blood vessel problems.  Persistent high blood pressure should be treated with medicine if weight loss and exercise  do not work.   Fat and cholesterol leaves deposits in your arteries that can block them. This causes heart disease and vessel disease elsewhere in your body.  If your cholesterol is found to be high, or if you have heart disease or certain other medical conditions, then you may need to have your cholesterol monitored frequently and be treated with medication.   Ask if you should have a cardiac stress test if your history suggests this. A stress test is a test done on a treadmill that looks for heart disease. This test can find disease prior to there being a problem.  Menopause can be associated with physical symptoms and risks. Hormone replacement therapy is available to decrease these. You should talk to your caregiver about whether starting or continuing to take hormones is right for you.   Osteoporosis is a disease in which the bones lose minerals and strength as we age. This can result in serious bone fractures. Risk of osteoporosis can be identified using a bone density scan. Women ages 54 and over should discuss this with their caregivers, as should women after menopause who have other risk factors. Ask your caregiver whether you should be taking a calcium supplement and Vitamin D, to reduce the rate of osteoporosis.   Avoid drinking alcohol in excess (more than two drinks per day).  Avoid use of street drugs. Do not share needles with anyone. Ask for professional help if you need assistance or instructions on stopping the use of alcohol, cigarettes, and/or drugs.  Brush your teeth twice a day with fluoride toothpaste, and floss once a day. Good oral hygiene prevents tooth decay and gum disease. The problems can be painful, unattractive, and can cause other health problems. Visit your dentist for a routine oral and dental check up and preventive care every 6-12 months.   Look at your skin regularly.  Use a mirror to look at your back. Notify your caregivers of changes in moles, especially if  there are changes in shapes, colors, a size larger than a pencil eraser, an irregular border, or development of new moles.  Safety:  Use seatbelts 100% of the time, whether driving or as a passenger.  Use safety devices such as hearing protection if you work in environments with loud noise or significant background noise.  Use safety glasses when doing any work that could send debris in to the eyes.  Use a helmet if you ride a bike or motorcycle.  Use appropriate safety gear for contact sports.  Talk to your caregiver about gun safety.  Use sunscreen with a SPF (or skin protection factor) of 15 or greater.  Lighter skinned people are at a greater risk of skin cancer. Don't forget to also wear sunglasses in order to protect your eyes from too much damaging sunlight. Damaging sunlight can accelerate cataract formation.   Practice safe sex. Use condoms. Condoms are used for birth control and to help reduce the spread of sexually transmitted infections (or STIs).  Some of the STIs are gonorrhea (the clap), chlamydia, syphilis, trichomonas, herpes, HPV (human  papilloma virus) and HIV (human immunodeficiency virus) which causes AIDS. The herpes, HIV and HPV are viral illnesses that have no cure. These can result in disability, cancer and death.   Keep carbon monoxide and smoke detectors in your home functioning at all times. Change the batteries every 6 months or use a model that plugs into the wall.   Vaccinations:  Stay up to date with your tetanus shots and other required immunizations. You should have a booster for tetanus every 10 years. Be sure to get your flu shot every year, since 5%-20% of the [place] population comes down with the flu. The flu vaccine changes each year, so being vaccinated once is not enough. Get your shot in the fall, before the flu season peaks.   Other vaccines to consider:  Human Papilloma Virus or HPV causes cancer of the cervix, and other infections that can be transmitted  from person to person. There is a vaccine for HPV, and females should get immunized between the ages of 32 and 38. It requires a series of 3 shots.   Pneumococcal vaccine to protect against certain types of pneumonia.  This is normally recommended for adults age 52 or older.  However, adults younger than 61 years old with certain underlying conditions such as diabetes, heart or lung disease should also receive the vaccine.  Shingles vaccine to protect against Varicella Zoster if you are older than age 58, or younger than 61 years old with certain underlying illness.  Hepatitis A vaccine to protect against a form of infection of the liver by a virus acquired from food.  Hepatitis B vaccine to protect against a form of infection of the liver by a virus acquired from blood or body fluids, particularly if you work in health care.  If you plan to travel internationally, check with your local health department for specific vaccination recommendations.  Cancer Screening:  Breast cancer screening is essential to preventive care for women. All women age 32 and older should perform a breast self-exam every month. At age 1 and older, women should have their caregiver complete a breast exam each year. Women at ages 2 and older should have a mammogram (x-ray film) of the breasts. Your caregiver can discuss how often you need mammograms.    Cervical cancer screening includes taking a Pap smear (sample of cells examined under a microscope) from the cervix (end of the uterus). It also includes testing for HPV (Human Papilloma Virus, which can cause cervical cancer). Screening and a pelvic exam should begin at age 8, or 3 years after a woman becomes sexually active. Screening should occur every year, with a Pap smear but no HPV testing, up to age 29. After age 110, you should have a Pap smear every 3 years with HPV testing, if no HPV was found previously.   Most routine colon cancer screening begins at the age of  50. On a yearly basis, doctors may provide special easy to use take-home tests to check for hidden blood in the stool. Sigmoidoscopy or colonoscopy can detect the earliest forms of colon cancer and is life saving. These tests use a small camera at the end of a tube to directly examine the colon. Speak to your caregiver about this at age 69, when routine screening begins (and is repeated every 5 years unless early forms of pre-cancerous polyps or small growths are found).

## 2014-03-24 NOTE — Progress Notes (Signed)
Complete Physical  Assessment and Plan: 3D MGM suggested due to density of breast PAP next year Due colonoscopy this year with Dr. Collene Mares Essential thrombocytosis- check CBC  Osteopenia- DEXA 2016  Migraines- controlled  Fibroids uterine-controlled  Hypertension-- continue medications, DASH diet, exercise and monitor at home. Call if greater than 130/80.     Discussed med's effects and SE's. Screening labs and tests as requested with regular follow-up as recommended.  HPI 63 y.o. female  presents for a complete physical. Her blood pressure has been controlled at home, today their BP is BP: 120/68 mmHg She does workout, walks a lot. She denies chest pain, shortness of breath, dizziness.  She is not on cholesterol medication and denies myalgias. Her cholesterol is at goal. The cholesterol last visit was: LDL 62  Last A1C in the office was: 5.4 Patient is on Vitamin D supplement.  Vitamin D 59  Current Medications:  Current Outpatient Prescriptions on File Prior to Visit  Medication Sig Dispense Refill  . cholecalciferol (VITAMIN D) 400 UNITS TABS Take 400 Units by mouth daily.        . Multiple Vitamin (MULTIVITAMIN) capsule Take 1 capsule by mouth daily.         No current facility-administered medications on file prior to visit.   Health Maintenance:   Immunization History  Administered Date(s) Administered  . Td 11/07/2004   Tetanus: 2006 Pneumovax: Flu vaccine: due 2016 Zostavax: Pap: 2013 due 2016 3DMGM: 03/2013 negative , dense breast DEXA: 03/2013, osteopenia t -1.7 due 2016 Colonoscopy: 2005 due this year Dr. Collene Mares EGD:  Allergies:  Allergies  Allergen Reactions  . Other     Cigarette smoke   Medical History:  Past Medical History  Diagnosis Date  . Essential thrombocytosis   . Fibrocystic breast disease   . Osteopenia   . Migraines   . Fibroids     Uterine  . Hypertension    Surgical History:  Past Surgical History  Procedure Laterality Date  .  Breast surgery      Left breast biopsy (benign)   Family History:  Family History  Problem Relation Age of Onset  . Cancer Mother     Breast  . Atrial fibrillation Father    Social History:  History  Substance Use Topics  . Smoking status: Never Smoker   . Smokeless tobacco: Not on file  . Alcohol Use: No    Review of Systems: [X]  = complains of  [ ]  = denies  General: Fatigue [ ]  Fever [ ]  Chills [ ]  Weakness [ ]   Insomnia [ ] Weight change [ ]  Night sweats [ ]   Change in appetite [ ]  Eyes: Redness [ ]  Blurred vision [ ]  Diplopia [ ]  Discharge [ ]   ENT: Congestion [ ]  Sinus Pain [ ]  Post Nasal Drip [ ]  Sore Throat [ ]  Earache [ ]  hearing loss [ ]  Tinnitus [ ]  Snoring [ ]   Cardiac: Chest pain/pressure [ ]  SOB [ ]  Orthopnea [ ]   Palpitations [ ]   Paroxysmal nocturnal dyspnea[ ]  Claudication [ ]  Edema [ ]   Pulmonary: Cough [ ]  Wheezing[ ]   SOB [ ]   Pleurisy [ ]   GI: Nausea [ ]  Vomiting[ ]  Dysphagia[ ]  Heartburn[ ]  Abdominal pain [ ]  Constipation [ ] ; Diarrhea [ ]  BRBPR [ ]  Melena[ ]  Bloating [ ]  Hemorrhoids [ ]   GU: Hematuria[ ]  Dysuria [ ]  Nocturia[ ]  Urgency [ ]   Hesitancy [ ]  Discharge [ ]  Frequency [ ]   Breast:  Breast lumps [ ]   nipple discharge [ ]    Neuro: Headaches[ ]  Vertigo[ ]  Paresthesias[ ]  Spasm [ ]  Speech changes [ ]  Incoordination [ ]   Ortho: Arthritis [ ]  Joint pain [ ]  Muscle pain [ ]  Joint swelling [ ]  Back Pain [ ]  Skin:  Rash [ ]   Pruritis [ ]  Change in skin lesion [ ]   Psych: Depression[ ]  Anxiety[ ]  Confusion [ ]  Memory loss [ ]   Heme/Lypmh: Bleeding [ ]  Bruising [ ]  Enlarged lymph nodes [ ]   Endocrine: Visual blurring [ ]  Paresthesia [ ]  Polyuria [ ]  Polydypsea [ ]    Heat/cold intolerance [ ]  Hypoglycemia [ ]   Physical Exam: Estimated body mass index is 21.9 kg/(m^2) as calculated from the following:   Height as of this encounter: 5\' 8"  (1.727 m).   Weight as of this encounter: 144 lb (65.318 kg). BP 120/68  Pulse 76  Temp(Src) 98.2 F (36.8 C)  Resp 16   Ht 5\' 8"  (1.727 m)  Wt 144 lb (65.318 kg)  BMI 21.90 kg/m2 General Appearance: Well nourished, in no apparent distress. Eyes: PERRLA, EOMs, conjunctiva no swelling or erythema, normal fundi and vessels. Sinuses: No Frontal/maxillary tenderness ENT/Mouth: Ext aud canals clear, normal light reflex with TMs without erythema, bulging.  Good dentition. No erythema, swelling, or exudate on post pharynx. Tonsils not swollen or erythematous. Hearing normal.  Neck: Supple, thyroid normal. No bruits Respiratory: Respiratory effort normal, BS equal bilaterally without rales, rhonchi, wheezing or stridor. Cardio: RRR without murmurs, rubs or gallops. Brisk peripheral pulses without edema.  Chest: symmetric, with normal excursions and percussion. Breasts: defer Abdomen: Soft, +BS. Non tender, no guarding, rebound, hernias, masses, or organomegaly. .  Lymphatics: Non tender without lymphadenopathy.  Genitourinary: defer Musculoskeletal: Full ROM all peripheral extremities,5/5 strength, and normal gait. Skin: Warm, dry without rashes, lesions, ecchymosis.  Neuro: Cranial nerves intact, reflexes equal bilaterally. Normal muscle tone, no cerebellar symptoms. Sensation intact.  Psych: Awake and oriented X 3, normal affect, Insight and Judgment appropriate.   EKG: WNL no changes.  [name] 10:19 AM

## 2014-03-25 LAB — URINALYSIS, ROUTINE W REFLEX MICROSCOPIC
Bilirubin Urine: NEGATIVE
Glucose, UA: NEGATIVE mg/dL
Hgb urine dipstick: NEGATIVE
Ketones, ur: NEGATIVE mg/dL
Leukocytes, UA: NEGATIVE
Nitrite: NEGATIVE
Protein, ur: NEGATIVE mg/dL
Specific Gravity, Urine: 1.011 (ref 1.005–1.030)
Urobilinogen, UA: 0.2 mg/dL (ref 0.0–1.0)
pH: 7.5 (ref 5.0–8.0)

## 2014-03-25 LAB — MICROALBUMIN / CREATININE URINE RATIO
Creatinine, Urine: 35 mg/dL
Microalb Creat Ratio: 14.3 mg/g (ref 0.0–30.0)
Microalb, Ur: 0.5 mg/dL (ref 0.00–1.89)

## 2014-03-25 LAB — INSULIN, FASTING: Insulin fasting, serum: 5 u[IU]/mL (ref 3–28)

## 2014-03-25 LAB — VITAMIN D 25 HYDROXY (VIT D DEFICIENCY, FRACTURES): Vit D, 25-Hydroxy: 64 ng/mL (ref 30–89)

## 2015-03-28 ENCOUNTER — Encounter: Payer: Self-pay | Admitting: Physician Assistant

## 2015-06-06 ENCOUNTER — Other Ambulatory Visit (HOSPITAL_COMMUNITY)
Admission: RE | Admit: 2015-06-06 | Discharge: 2015-06-06 | Disposition: A | Discharge status: Home / Self Care | Payer: No Typology Code available for payment source | Source: Ambulatory Visit | Attending: Physician Assistant | Admitting: Physician Assistant

## 2015-06-06 ENCOUNTER — Ambulatory Visit (INDEPENDENT_AMBULATORY_CARE_PROVIDER_SITE_OTHER): Payer: PRIVATE HEALTH INSURANCE | Admitting: Physician Assistant

## 2015-06-06 VITALS — BP 128/74 | HR 72 | Temp 97.7°F | Resp 16 | Ht 68.0 in | Wt 145.2 lb

## 2015-06-06 DIAGNOSIS — E559 Vitamin D deficiency, unspecified: Secondary | ICD-10-CM

## 2015-06-06 DIAGNOSIS — G43809 Other migraine, not intractable, without status migrainosus: Secondary | ICD-10-CM

## 2015-06-06 DIAGNOSIS — Z1389 Encounter for screening for other disorder: Secondary | ICD-10-CM

## 2015-06-06 DIAGNOSIS — Z0000 Encounter for general adult medical examination without abnormal findings: Secondary | ICD-10-CM | POA: Diagnosis not present

## 2015-06-06 DIAGNOSIS — Z23 Encounter for immunization: Secondary | ICD-10-CM

## 2015-06-06 DIAGNOSIS — Z13220 Encounter for screening for lipoid disorders: Secondary | ICD-10-CM

## 2015-06-06 DIAGNOSIS — Z1151 Encounter for screening for human papillomavirus (HPV): Secondary | ICD-10-CM | POA: Insufficient documentation

## 2015-06-06 DIAGNOSIS — D696 Thrombocytopenia, unspecified: Secondary | ICD-10-CM | POA: Insufficient documentation

## 2015-06-06 DIAGNOSIS — I10 Essential (primary) hypertension: Secondary | ICD-10-CM

## 2015-06-06 DIAGNOSIS — Z124 Encounter for screening for malignant neoplasm of cervix: Secondary | ICD-10-CM

## 2015-06-06 DIAGNOSIS — Z131 Encounter for screening for diabetes mellitus: Secondary | ICD-10-CM

## 2015-06-06 DIAGNOSIS — D259 Leiomyoma of uterus, unspecified: Secondary | ICD-10-CM

## 2015-06-06 DIAGNOSIS — Z79899 Other long term (current) drug therapy: Secondary | ICD-10-CM

## 2015-06-06 DIAGNOSIS — D473 Essential (hemorrhagic) thrombocythemia: Secondary | ICD-10-CM

## 2015-06-06 DIAGNOSIS — N6019 Diffuse cystic mastopathy of unspecified breast: Secondary | ICD-10-CM

## 2015-06-06 DIAGNOSIS — Z01419 Encounter for gynecological examination (general) (routine) without abnormal findings: Secondary | ICD-10-CM | POA: Insufficient documentation

## 2015-06-06 DIAGNOSIS — M8580 Other specified disorders of bone density and structure, unspecified site: Secondary | ICD-10-CM

## 2015-06-06 DIAGNOSIS — D75839 Thrombocytosis, unspecified: Secondary | ICD-10-CM

## 2015-06-06 LAB — CBC WITH DIFFERENTIAL/PLATELET
Basophils Absolute: 0 10*3/uL (ref 0.0–0.1)
Basophils Relative: 1 % (ref 0–1)
Eosinophils Absolute: 0.1 10*3/uL (ref 0.0–0.7)
Eosinophils Relative: 3 % (ref 0–5)
HCT: 40 % (ref 36.0–46.0)
Hemoglobin: 13.1 g/dL (ref 12.0–15.0)
Lymphocytes Relative: 28 % (ref 12–46)
Lymphs Abs: 1.4 10*3/uL (ref 0.7–4.0)
MCH: 30.5 pg (ref 26.0–34.0)
MCHC: 32.8 g/dL (ref 30.0–36.0)
MCV: 93.2 fL (ref 78.0–100.0)
MPV: 11.7 fL (ref 8.6–12.4)
Monocytes Absolute: 0.5 10*3/uL (ref 0.1–1.0)
Monocytes Relative: 10 % (ref 3–12)
Neutro Abs: 2.8 10*3/uL (ref 1.7–7.7)
Neutrophils Relative %: 58 % (ref 43–77)
Platelets: 132 10*3/uL — ABNORMAL LOW (ref 150–400)
RBC: 4.29 MIL/uL (ref 3.87–5.11)
RDW: 13.2 % (ref 11.5–15.5)
WBC: 4.9 10*3/uL (ref 4.0–10.5)

## 2015-06-06 LAB — HEMOGLOBIN A1C
Hgb A1c MFr Bld: 5.6 % (ref <5.7)
Mean Plasma Glucose: 114 mg/dL (ref <117)

## 2015-06-06 NOTE — Progress Notes (Signed)
Complete Physical  Assessment and Plan: 1. Essential hypertension - continue medications, DASH diet, exercise and monitor at home. Call if greater than 130/80.  - CBC with Differential/Platelet - BASIC METABOLIC PANEL WITH GFR - Hepatic function panel - TSH - Urinalysis, Routine w reflex microscopic (not at [facility]) - Microalbumin / creatinine urine ratio  2. Osteopenia Osteopenia- get dexa, continue Vit D and Ca, weight bearing exercises - DG Bone Density; Future  3. Other migraine without status migrainosus, not intractable Remission/controlled  4. Uterine leiomyoma, unspecified location Better with menopause  5. Thrombocytosis Check CBC  6. Fibrocystic breast disease, unspecified laterality Get 3 D MGM  7. Routine general medical examination at a health care facility  8. Screening cholesterol level - Lipid panel  9. Screening for diabetes mellitus - Hemoglobin A1c - Insulin, fasting  10. Screening for blood or protein in urine - Urinalysis, Routine w reflex microscopic (not at [facility]) - Microalbumin / creatinine urine ratio  11. Vitamin D deficiency - Vit D  25 hydroxy (rtn osteoporosis monitoring)  12. Medication management - Magnesium  13. Need for prophylactic vaccination with combined diphtheria-tetanus-pertussis (DTP) vaccine - Tdap vaccine greater than or equal to 7yo IM  14. Screening for cervical cancer - Cytology - PAP    Discussed med's effects and SE's. Screening labs and tests as requested with regular follow-up as recommended.  HPI 64 y.o. female  presents for a complete physical. Her blood pressure has been controlled at home, today their BP is BP: 128/74 mmHg She does workout, walks a lot. She denies chest pain, shortness of breath, dizziness.  She is not on cholesterol medication and denies myalgias. Her cholesterol is at goal. The cholesterol last visit was: Lab Results  Component Value Date   CHOL 175 03/24/2014   HDL 95 03/24/2014   LDLCALC 68 03/24/2014   TRIG 58 03/24/2014   CHOLHDL 1.8 03/24/2014   Last A1C in the office was: Lab Results  Component Value Date   HGBA1C 5.5 03/24/2014   Patient is on Vitamin D supplement, 4000 IU daily.  Lab Results  Component Value Date   VD25OH 64 03/24/2014    Current Medications:  Current Outpatient Prescriptions on File Prior to Visit  Medication Sig Dispense Refill  . cholecalciferol (VITAMIN D) 400 UNITS TABS Take 400 Units by mouth daily.      . Multiple Vitamin (MULTIVITAMIN) capsule Take 1 capsule by mouth daily.       No current facility-administered medications on file prior to visit.   Health Maintenance:   Immunization History  Administered Date(s) Administered  . Td 11/07/2004   Tetanus: 2006 DUE Pneumovax: N/A Prevnar 13: due age 79 Flu vaccine: will get later Zostavax: N/A Pap: 2013 due 2016 3DMGM: 03/2013 negative , dense breast DEXA: 03/2013, osteopenia t -1.7 due 2016 Colonoscopy: 2005 due this year Dr. Collene Mares EGD:  Allergies:  Allergies  Allergen Reactions  . Other     Cigarette smoke   Medical History:  Past Medical History  Diagnosis Date  . Essential thrombocytosis   . Fibrocystic breast disease   . Osteopenia   . Migraines   . Fibroids     Uterine  . Hypertension    Surgical History:  Past Surgical History  Procedure Laterality Date  . Breast surgery      Left breast biopsy (benign)   Family History:  Family History  Problem Relation Age of Onset  . Cancer Mother     Breast  . Atrial  fibrillation Father    Social History:  Social History  Substance Use Topics  . Smoking status: Never Smoker   . Smokeless tobacco: Not on file  . Alcohol Use: No   Review of Systems  Constitutional: Negative.   HENT: Negative.   Eyes: Negative.   Respiratory: Negative.   Cardiovascular: Negative.   Gastrointestinal: Negative.   Genitourinary: Negative.   Musculoskeletal: Negative.   Skin: Negative.   Neurological:  Negative.   Endo/Heme/Allergies: Negative.   Psychiatric/Behavioral: Negative.      Physical Exam: Estimated body mass index is 22.08 kg/(m^2) as calculated from the following:   Height as of this encounter: 5\' 8"  (1.727 m).   Weight as of this encounter: 145 lb 3.2 oz (65.862 kg). BP 128/74 mmHg  Pulse 72  Temp(Src) 97.7 F (36.5 C)  Resp 16  Ht 5\' 8"  (1.727 m)  Wt 145 lb 3.2 oz (65.862 kg)  BMI 22.08 kg/m2 General Appearance: Well nourished, in no apparent distress. Eyes: PERRLA, EOMs, conjunctiva no swelling or erythema, normal fundi and vessels. Sinuses: No Frontal/maxillary tenderness ENT/Mouth: Ext aud canals clear, normal light reflex with TMs without erythema, bulging.  Good dentition. No erythema, swelling, or exudate on post pharynx. Tonsils not swollen or erythematous. Hearing normal.  Neck: Supple, thyroid normal. No bruits Respiratory: Respiratory effort normal, BS equal bilaterally without rales, rhonchi, wheezing or stridor. Cardio: RRR without murmurs, rubs or gallops. Brisk peripheral pulses without edema.  Chest: symmetric, with normal excursions and percussion. Breasts: defer Abdomen: Soft, +BS. Non tender, no guarding, rebound, hernias, masses, or organomegaly. .  Lymphatics: Non tender without lymphadenopathy.  Genitourinary: normal external genitalia, vulva, vagina other than vaginal atrophy, cervix, uterus and adnexa, PAP: Pap smear done today. Musculoskeletal: Full ROM all peripheral extremities,5/5 strength, and normal gait. Skin: Warm, dry without rashes, lesions, ecchymosis.  Neuro: Cranial nerves intact, reflexes equal bilaterally. Normal muscle tone, no cerebellar symptoms. Sensation intact.  Psych: Awake and oriented X 3, normal affect, Insight and Judgment appropriate.   EKG: defer  Cynthia Lin 9:12 AM

## 2015-06-06 NOTE — Patient Instructions (Addendum)
Encourage you to get the 3D Mammogram  The 3D Mammogram is much more specific and sensitive to pick up breast cancer. For women with fibrocystic breast or lumpy breast it can be hard to determine if it is cancer or not but the 3D mammogram is able to tell this difference which cuts back on unneeded additional tests or scary call backs.   - over 40% increase in detection of breast cancer - over 40% reduction in false positives.  - fewer call backs - reduced anxiety - improved outcomes - [facility]  7 a.m.-6:30 p.m., Monday 7 a.m.-5 p.m., Tuesday-Friday Schedule an appointment by calling [phone].  [facility] Schedule an appointment by calling [phone].  Call Dr. Collene Mares [phone]  Preventive Care for Adults  A healthy lifestyle and preventive care can promote health and wellness. Preventive health guidelines for women include the following key practices.  A routine yearly physical is a good way to check with your health care provider about your health and preventive screening. It is a chance to share any concerns and updates on your health and to receive a thorough exam.  Visit your dentist for a routine exam and preventive care every 6 months. Brush your teeth twice a day and floss once a day. Good oral hygiene prevents tooth decay and gum disease.  The frequency of eye exams is based on your age, health, family medical history, use of contact lenses, and other factors. Follow your health care provider's recommendations for frequency of eye exams.  Eat a healthy diet. Foods like vegetables, fruits, whole grains, low-fat dairy products, and lean protein foods contain the nutrients you need without too many calories. Decrease your intake of foods high in solid fats, added sugars, and salt. Eat the right amount of calories for you.Get information about a proper diet from your health care provider, if necessary.  Regular  physical exercise is one of the most important things you can do for your health. Most adults should get at least 150 minutes of moderate-intensity exercise (any activity that increases your heart rate and causes you to sweat) each week. In addition, most adults need muscle-strengthening exercises on 2 or more days a week.  Maintain a healthy weight. The body mass index (BMI) is a screening tool to identify possible weight problems. It provides an estimate of body fat based on height and weight. Your health care provider can find your BMI and can help you achieve or maintain a healthy weight.For adults 20 years and older:  A BMI below 18.5 is considered underweight.  A BMI of 18.5 to 24.9 is normal.  A BMI of 25 to 29.9 is considered overweight.  A BMI of 30 and above is considered obese.  Maintain normal blood lipids and cholesterol levels by exercising and minimizing your intake of saturated fat. Eat a balanced diet with plenty of fruit and vegetables. Blood tests for lipids and cholesterol should begin at age 70 and be repeated every 5 years. If your lipid or cholesterol levels are high, you are over 50, or you are at high risk for heart disease, you may need your cholesterol levels checked more frequently.Ongoing high lipid and cholesterol levels should be treated with medicines if diet and exercise are not working.  If you smoke, find out from your health care provider how to quit. If you do not use tobacco, do not start.  Lung cancer screening is recommended for adults aged 24-80  years who are at high risk for developing lung cancer because of a history of smoking. A yearly low-dose CT scan of the lungs is recommended for people who have at least a 30-pack-year history of smoking and are a current smoker or have quit within the past 15 years. A pack year of smoking is smoking an average of 1 pack of cigarettes a day for 1 year (for example: 1 pack a day for 30 years or 2 packs a day for 15  years). Yearly screening should continue until the smoker has stopped smoking for at least 15 years. Yearly screening should be stopped for people who develop a health problem that would prevent them from having lung cancer treatment.  High blood pressure causes heart disease and increases the risk of stroke. Your blood pressure should be checked at least every 1 to 2 years. Ongoing high blood pressure should be treated with medicines if weight loss and exercise do not work.  If you are 56-73 years old, ask your health care provider if you should take aspirin to prevent strokes.  Diabetes screening involves taking a blood sample to check your fasting blood sugar level. This should be done once every 3 years, after age 75, if you are within normal weight and without risk factors for diabetes. Testing should be considered at a younger age or be carried out more frequently if you are overweight and have at least 1 risk factor for diabetes.  Breast cancer screening is essential preventive care for women. You should practice "breast self-awareness." This means understanding the normal appearance and feel of your breasts and may include breast self-examination. Any changes detected, no matter how small, should be reported to a health care provider. Women in their 72s and 30s should have a clinical breast exam (CBE) by a health care provider as part of a regular health exam every 1 to 3 years. After age 73, women should have a CBE every year. Starting at age 21, women should consider having a mammogram (breast X-ray test) every year. Women who have a family history of breast cancer should talk to their health care provider about genetic screening. Women at a high risk of breast cancer should talk to their health care providers about having an MRI and a mammogram every year.  Breast cancer gene (BRCA)-related cancer risk assessment is recommended for women who have family members with BRCA-related cancers.  BRCA-related cancers include breast, ovarian, tubal, and peritoneal cancers. Having family members with these cancers may be associated with an increased risk for harmful changes (mutations) in the breast cancer genes BRCA1 and BRCA2. Results of the assessment will determine the need for genetic counseling and BRCA1 and BRCA2 testing.  Routine pelvic exams to screen for cancer are no longer recommended for nonpregnant women who are considered low risk for cancer of the pelvic organs (ovaries, uterus, and vagina) and who do not have symptoms. Ask your health care provider if a screening pelvic exam is right for you.  If you have had past treatment for cervical cancer or a condition that could lead to cancer, you need Pap tests and screening for cancer for at least 20 years after your treatment. If Pap tests have been discontinued, your risk factors (such as having a new sexual partner) need to be reassessed to determine if screening should be resumed. Some women have medical problems that increase the chance of getting cervical cancer. In these cases, your health care provider may recommend more frequent  screening and Pap tests.  Colorectal cancer can be detected and often prevented. Most routine colorectal cancer screening begins at the age of 31 years and continues through age 63 years. However, your health care provider may recommend screening at an earlier age if you have risk factors for colon cancer. On a yearly basis, your health care provider may provide home test kits to check for hidden blood in the stool. Use of a small camera at the end of a tube, to directly examine the colon (sigmoidoscopy or colonoscopy), can detect the earliest forms of colorectal cancer. Talk to your health care provider about this at age 71, when routine screening begins. Direct exam of the colon should be repeated every 5-10 years through age 49 years, unless early forms of pre-cancerous polyps or small growths are  found.  Hepatitis C blood testing is recommended for all people born from [birth year] through 1965 and any individual with known risks for hepatitis C.  Pra  Osteoporosis is a disease in which the bones lose minerals and strength with aging. This can result in serious bone fractures or breaks. The risk of osteoporosis can be identified using a bone density scan. Women ages 25 years and over and women at risk for fractures or osteoporosis should discuss screening with their health care providers. Ask your health care provider whether you should take a calcium supplement or vitamin D to reduce the rate of osteoporosis.  Menopause can be associated with physical symptoms and risks. Hormone replacement therapy is available to decrease symptoms and risks. You should talk to your health care provider about whether hormone replacement therapy is right for you.  Use sunscreen. Apply sunscreen liberally and repeatedly throughout the day. You should seek shade when your shadow is shorter than you. Protect yourself by wearing long sleeves, pants, a wide-brimmed hat, and sunglasses year round, whenever you are outdoors.  Once a month, do a whole body skin exam, using a mirror to look at the skin on your back. Tell your health care provider of new moles, moles that have irregular borders, moles that are larger than a pencil eraser, or moles that have changed in shape or color.  Stay current with required vaccines (immunizations).  Influenza vaccine. All adults should be immunized every year.  Tetanus, diphtheria, and acellular pertussis (Td, Tdap) vaccine. Pregnant women should receive 1 dose of Tdap vaccine during each pregnancy. The dose should be obtained regardless of the length of time since the last dose. Immunization is preferred during the 27th-36th week of gestation. An adult who has not previously received Tdap or who does not know her vaccine status should receive 1 dose of Tdap. This initial dose should be  followed by tetanus and diphtheria toxoids (Td) booster doses every 10 years. Adults with an unknown or incomplete history of completing a 3-dose immunization series with Td-containing vaccines should begin or complete a primary immunization series including a Tdap dose. Adults should receive a Td booster every 10 years.  Varicella vaccine. An adult without evidence of immunity to varicella should receive 2 doses or a second dose if she has previously received 1 dose. Pregnant females who do not have evidence of immunity should receive the first dose after pregnancy. This first dose should be obtained before leaving the health care facility. The second dose should be obtained 4-8 weeks after the first dose.  Human papillomavirus (HPV) vaccine. Females aged 13-26 years who have not received the vaccine previously should obtain the 3-dose series.  The vaccine is not recommended for use in pregnant females. However, pregnancy testing is not needed before receiving a dose. If a female is found to be pregnant after receiving a dose, no treatment is needed. In that case, the remaining doses should be delayed until after the pregnancy. Immunization is recommended for any person with an immunocompromised condition through the age of 72 years if she did not get any or all doses earlier. During the 3-dose series, the second dose should be obtained 4-8 weeks after the first dose. The third dose should be obtained 24 weeks after the first dose and 16 weeks after the second dose.  Zoster vaccine. One dose is recommended for adults aged 36 years or older unless certain conditions are present.  Measles, mumps, and rubella (MMR) vaccine. Adults born before [birth year] generally are considered immune to measles and mumps. Adults born in [birth year] or later should have 1 or more doses of MMR vaccine unless there is a contraindication to the vaccine or there is laboratory evidence of immunity to each of the three diseases. A routine second  dose of MMR vaccine should be obtained at least 28 days after the first dose for students attending postsecondary schools, health care workers, or international travelers. People who received inactivated measles vaccine or an unknown type of measles vaccine during 1963-1967 should receive 2 doses of MMR vaccine. People who received inactivated mumps vaccine or an unknown type of mumps vaccine before 1979 and are at high risk for mumps infection should consider immunization with 2 doses of MMR vaccine. For females of childbearing age, rubella immunity should be determined. If there is no evidence of immunity, females who are not pregnant should be vaccinated. If there is no evidence of immunity, females who are pregnant should delay immunization until after pregnancy. Unvaccinated health care workers born before [birth year] who lack laboratory evidence of measles, mumps, or rubella immunity or laboratory confirmation of disease should consider measles and mumps immunization with 2 doses of MMR vaccine or rubella immunization with 1 dose of MMR vaccine.  Pneumococcal 13-valent conjugate (PCV13) vaccine. When indicated, a person who is uncertain of her immunization history and has no record of immunization should receive the PCV13 vaccine. An adult aged 26 years or older who has certain medical conditions and has not been previously immunized should receive 1 dose of PCV13 vaccine. This PCV13 should be followed with a dose of pneumococcal polysaccharide (PPSV23) vaccine. The PPSV23 vaccine dose should be obtained at least 8 weeks after the dose of PCV13 vaccine. An adult aged 41 years or older who has certain medical conditions and previously received 1 or more doses of PPSV23 vaccine should receive 1 dose of PCV13. The PCV13 vaccine dose should be obtained 1 or more years after the last PPSV23 vaccine dose.    Pneumococcal polysaccharide (PPSV23) vaccine. When PCV13 is also indicated, PCV13 should be obtained first.  All adults aged 39 years and older should be immunized. An adult younger than age 63 years who has certain medical conditions should be immunized. Any person who resides in a nursing home or long-term care facility should be immunized. An adult smoker should be immunized. People with an immunocompromised condition and certain other conditions should receive both PCV13 and PPSV23 vaccines. People with human immunodeficiency virus (HIV) infection should be immunized as soon as possible after diagnosis. Immunization during chemotherapy or radiation therapy should be avoided. Routine use of PPSV23 vaccine is not recommended for American Indians, [place], or  people younger than 65 years unless there are medical conditions that require PPSV23 vaccine. When indicated, people who have unknown immunization and have no record of immunization should receive PPSV23 vaccine. One-time revaccination 5 years after the first dose of PPSV23 is recommended for people aged 19-64 years who have chronic kidney failure, nephrotic syndrome, asplenia, or immunocompromised conditions. People who received 1-2 doses of PPSV23 before age 77 years should receive another dose of PPSV23 vaccine at age 70 years or later if at least 5 years have passed since the previous dose. Doses of PPSV23 are not needed for people immunized with PPSV23 at or after age 42 years.  Preventive Services / Frequency   Ages 43 to 67 years  Blood pressure check.  Lipid and cholesterol check.  Lung cancer screening. / Every year if you are aged 80-80 years and have a 30-pack-year history of smoking and currently smoke or have quit within the past 15 years. Yearly screening is stopped once you have quit smoking for at least 15 years or develop a health problem that would prevent you from having lung cancer treatment.  Clinical breast exam.** / Every year after age 39 years.  BRCA-related cancer risk assessment.** / For women who have family members  with a BRCA-related cancer (breast, ovarian, tubal, or peritoneal cancers).  Mammogram.** / Every year beginning at age 32 years and continuing for as long as you are in good health. Consult with your health care provider.  Pap test.** / Every 3 years starting at age 56 years through age 11 or 65 years with a history of 3 consecutive normal Pap tests.  HPV screening.** / Every 3 years from ages 9 years through ages 11 to 79 years with a history of 3 consecutive normal Pap tests.  Fecal occult blood test (FOBT) of stool. / Every year beginning at age 19 years and continuing until age 65 years. You may not need to do this test if you get a colonoscopy every 10 years.  Flexible sigmoidoscopy or colonoscopy.** / Every 5 years for a flexible sigmoidoscopy or every 10 years for a colonoscopy beginning at age 97 years and continuing until age 22 years.  Hepatitis C blood test.** / For all people born from [birth year] through 1963 and any individual with known risks for hepatitis C.  Skin self-exam. / Monthly.  Influenza vaccine. / Every year.  Tetanus, diphtheria, and acellular pertussis (Tdap/Td) vaccine.** / Consult your health care provider. Pregnant women should receive 1 dose of Tdap vaccine during each pregnancy. 1 dose of Td every 10 years.  Varicella vaccine.** / Consult your health care provider. Pregnant females who do not have evidence of immunity should receive the first dose after pregnancy.  Zoster vaccine.** / 1 dose for adults aged 81 years or older.  Pneumococcal 13-valent conjugate (PCV13) vaccine.** / Consult your health care provider.  Pneumococcal polysaccharide (PPSV23) vaccine.** / 1 to 2 doses if you smoke cigarettes or if you have certain conditions.  Meningococcal vaccine.** / Consult your health care provider.  Hepatitis A vaccine.** / Consult your health care provider.  Hepatitis B vaccine.** / Consult your health care provider. Screening for abdominal aortic aneurysm  (AAA)  by ultrasound is recommended for people over 50 who have history of high blood pressure or who are current or former smokers.  VAGINAL DRYNESS OVERVIEW  Vaginal dryness, also known as atrophic vaginitis, is a common condition in postmenopausal women. This condition is also common in women who have had both  ovaries removed at the time of hysterectomy.   Some women have uncomfortable symptoms of vaginal dryness, such as pain with sex, burning vaginal discomfort or itching, or abnormal vaginal discharge, while others have no symptoms at all.  VAGINAL DRYNESS CAUSES   Estrogen helps to keep the vagina moist and to maintain thickness of the vaginal lining. Vaginal dryness occurs when the ovaries produce a decreased amount of estrogen. This can occur at certain times in a woman's life, and may be permanent or temporary. Times when less estrogen is made include: ?At the time of menopause. ?After surgical removal of the ovaries, chemotherapy, or radiation therapy of the pelvis for cancer. ?After having a baby, particularly in women who breastfeed. ?While using certain medications, such as danazol, medroxyprogesterone (brand names: Provera or DepoProvera), leuprolide (brand name: Lupron), or nafarelin. When these medications are stopped, estrogen production resumes.  Women who smoke cigarettes have been shown to have an increased risk of an earlier menopause transition as compared to non-smokers. Therefore, atrophic vaginitis symptoms may appear at a younger age in this population.  VAGINAL DRYNESS TREATMENT   There are three treatment options for women with vaginal dryness:  Vaginal lubricants and moisturizers - Vaginal lubricants and moisturizers can be purchased without a prescription. These products do not contain any hormones and have virtually no side effects. - Albolene is found in the facial cleanser section at [facility], [facility], or [employer]. It is a large jar with a blue top. This is the best  lubricant for women because it is hypoallergenic. -Natural lubricants, such as olive, avocado or peanut oil, are easily available products that may be used as a lubricant with sex.  -Vaginal moisturizes (eg, Replens, Moist Again, Vagisil, K-Y Silk-E, and Feminease) are formulated to allow water to be retained in the vaginal tissues. Moisturizers are applied into the vagina three times weekly to allow a continued moisturizing effect. These should not be used just before having sex, as they can be irritating.  Vaginal estrogen - Vaginal estrogen is the most effective treatment option for women with vaginal dryness. Vaginal estrogen must be prescribed by a healthcare provider. Very low doses of vaginal estrogen can be used when it is put into the vagina to treat vaginal dryness. A small amount of estrogen is absorbed into the bloodstream, but only about 100 times less than when using estrogen pills or tablets. As a result, there is a much lower risk of side effects, such as blood clots, breast cancer, and heart attack, compared with other estrogen-containing products (birth control pills, menopausal hormone therapy).   Ospemifene - Ospemifene is a prescription medication that is similar to estrogen, but is not estrogen. In the vaginal tissue, it acts similarly to estrogen. In the breast tissue, it acts as an estrogen blocker. It comes in a pill, and is prescribed for women who want to use an estrogen-like medication for vaginal dryness or painful sex associated with vaginal dryness, but prefer not to use a vaginal medication. The medication may cause hot flashes as a side effect. This type of medication may increase the risk of blood clots or uterine cancer. Further study of ospemifene is needed to evaluate the risk of these complications. This medication has not been tested in women who have had breast cancer or are at a high risk of developing breast cancer.    Sexual activity - Vaginal estrogen improves  vaginal dryness quickly, usually within a few weeks. You may continue to have sex as you treat  vaginal dryness because sex itself can help to keep the vaginal tissues healthy. Vaginal intercourse may help the vaginal tissues by keeping them soft and stretchable and preventing the tissues from shrinking.  If sex continues to be painful despite treatment for vaginal dryness, talk to your healthcare provider.

## 2015-06-07 LAB — BASIC METABOLIC PANEL WITH GFR
BUN: 17 mg/dL (ref 7–25)
CO2: 29 mmol/L (ref 20–31)
Calcium: 9.8 mg/dL (ref 8.6–10.4)
Chloride: 103 mmol/L (ref 98–110)
Creat: 0.91 mg/dL (ref 0.50–0.99)
GFR, Est African American: 79 mL/min (ref >=60)
GFR, Est Non African American: 68 mL/min (ref >=60)
Glucose, Bld: 89 mg/dL (ref 65–99)
Potassium: 4.1 mmol/L (ref 3.5–5.3)
Sodium: 140 mmol/L (ref 135–146)

## 2015-06-07 LAB — HEPATIC FUNCTION PANEL
ALT: 18 U/L (ref 6–29)
AST: 22 U/L (ref 10–35)
Albumin: 4.2 g/dL (ref 3.6–5.1)
Alkaline Phosphatase: 55 U/L (ref 33–130)
Bilirubin, Direct: 0.1 mg/dL (ref <=0.2)
Indirect Bilirubin: 0.5 mg/dL (ref 0.2–1.2)
Total Bilirubin: 0.6 mg/dL (ref 0.2–1.2)
Total Protein: 6.7 g/dL (ref 6.1–8.1)

## 2015-06-07 LAB — LIPID PANEL
Cholesterol: 173 mg/dL (ref 125–200)
HDL: 105 mg/dL (ref >=46)
LDL Cholesterol: 49 mg/dL (ref <130)
Total CHOL/HDL Ratio: 1.6 Ratio (ref <=5.0)
Triglycerides: 94 mg/dL (ref <150)
VLDL: 19 mg/dL (ref <30)

## 2015-06-07 LAB — MAGNESIUM: Magnesium: 2.1 mg/dL (ref 1.5–2.5)

## 2015-06-07 LAB — URINALYSIS, ROUTINE W REFLEX MICROSCOPIC
Bilirubin Urine: NEGATIVE
Glucose, UA: NEGATIVE
Hgb urine dipstick: NEGATIVE
Ketones, ur: NEGATIVE
Leukocytes, UA: NEGATIVE
Nitrite: NEGATIVE
Protein, ur: NEGATIVE
Specific Gravity, Urine: 1.009 (ref 1.001–1.035)
pH: 6.5 (ref 5.0–8.0)

## 2015-06-07 LAB — INSULIN, FASTING: Insulin fasting, serum: 3.7 u[IU]/mL (ref 2.0–19.6)

## 2015-06-07 LAB — MICROALBUMIN / CREATININE URINE RATIO
Creatinine, Urine: 47.6 mg/dL
Microalb Creat Ratio: 4.2 mg/g (ref 0.0–30.0)
Microalb, Ur: 0.2 mg/dL (ref <2.0)

## 2015-06-07 LAB — TSH: TSH: 0.998 u[IU]/mL (ref 0.350–4.500)

## 2015-06-07 LAB — CYTOLOGY - PAP

## 2015-06-07 LAB — VITAMIN D 25 HYDROXY (VIT D DEFICIENCY, FRACTURES): Vit D, 25-Hydroxy: 49 ng/mL (ref 30–100)

## 2016-03-28 ENCOUNTER — Encounter: Payer: Self-pay | Admitting: Physician Assistant

## 2016-06-05 ENCOUNTER — Encounter: Payer: Self-pay | Admitting: Physician Assistant

## 2016-06-05 ENCOUNTER — Ambulatory Visit (INDEPENDENT_AMBULATORY_CARE_PROVIDER_SITE_OTHER): Payer: BLUE CROSS/BLUE SHIELD | Admitting: Physician Assistant

## 2016-06-05 VITALS — BP 118/76 | HR 86 | Temp 97.5°F | Resp 16 | Ht 68.0 in | Wt 145.2 lb

## 2016-06-05 DIAGNOSIS — D75839 Thrombocytosis, unspecified: Secondary | ICD-10-CM

## 2016-06-05 DIAGNOSIS — Z0000 Encounter for general adult medical examination without abnormal findings: Secondary | ICD-10-CM

## 2016-06-05 DIAGNOSIS — G43809 Other migraine, not intractable, without status migrainosus: Secondary | ICD-10-CM

## 2016-06-05 DIAGNOSIS — I10 Essential (primary) hypertension: Secondary | ICD-10-CM

## 2016-06-05 DIAGNOSIS — E559 Vitamin D deficiency, unspecified: Secondary | ICD-10-CM

## 2016-06-05 DIAGNOSIS — M8580 Other specified disorders of bone density and structure, unspecified site: Secondary | ICD-10-CM

## 2016-06-05 DIAGNOSIS — D473 Essential (hemorrhagic) thrombocythemia: Secondary | ICD-10-CM

## 2016-06-05 DIAGNOSIS — Z1389 Encounter for screening for other disorder: Secondary | ICD-10-CM

## 2016-06-05 DIAGNOSIS — D259 Leiomyoma of uterus, unspecified: Secondary | ICD-10-CM

## 2016-06-05 DIAGNOSIS — Z79899 Other long term (current) drug therapy: Secondary | ICD-10-CM

## 2016-06-05 DIAGNOSIS — N6019 Diffuse cystic mastopathy of unspecified breast: Secondary | ICD-10-CM

## 2016-06-05 NOTE — Progress Notes (Signed)
Complete Physical  Assessment and Plan: Essential hypertension - continue medications, DASH diet, exercise and monitor at home. Call if greater than 130/80.  - Urinalysis, Routine w reflex microscopic (not at [facility]) - Microalbumin / creatinine urine ratio  Osteopenia Osteopenia- get dexa, continue Vit D and Ca, weight bearing exercises  Other migraine without status migrainosus, not intractable Remission/controlled  Uterine leiomyoma, unspecified location Better with menopause  Thrombocytosis Check CBC FROM DR [facility]  Fibrocystic breast disease, unspecified laterality Get 3 D MGM  Screening for blood or protein in urine - Urinalysis, Routine w reflex microscopic (not at [facility]) - Microalbumin / creatinine urine ratio  Vitamin D deficiency   Medication management   Discussed med's effects and SE's. Screening labs and tests as requested with regular follow-up as recommended. DECLINES LABS, HAD THEM DONE AT DR. MANN'S OFFICE PRIOR TO COLONOSCOPY WILL SEND COPY  HPI 65 y.o. female  presents for a complete physical. Her blood pressure has been controlled at home, today their BP is BP: 118/76 She does workout, walks a lot. She denies chest pain, shortness of breath, dizziness.  She is not on cholesterol medication and denies myalgias. Her cholesterol is at goal. The cholesterol last visit was: Lab Results  Component Value Date   CHOL 173 06/06/2015   HDL 105 06/06/2015   LDLCALC 49 06/06/2015   TRIG 94 06/06/2015   CHOLHDL 1.6 06/06/2015   Last A1C in the office was: Lab Results  Component Value Date   HGBA1C 5.6 06/06/2015   Patient is on Vitamin D supplement, 4000 IU daily.  Lab Results  Component Value Date   VD25OH 49 06/06/2015    Current Medications:  Current Outpatient Prescriptions on File Prior to Visit  Medication Sig Dispense Refill  . cholecalciferol (VITAMIN D) 400 UNITS TABS Take 400 Units by mouth daily.      . Multiple Vitamin (MULTIVITAMIN)  capsule Take 1 capsule by mouth daily.       No current facility-administered medications on file prior to visit.    Health Maintenance:   Immunization History  Administered Date(s) Administered  . Td 11/07/2004  . Tdap 06/06/2015   Tetanus: 2016 Pneumovax: N/A Prevnar 13: due age 65 Flu vaccine: will get later Zostavax: N/A  Pap: 2016 3DMGM: 05/28/2016 negative , dense breast DEXA: 03/2013, osteopenia t -1.7 due 2016 Colonoscopy: 2005 due this year Dr. Collene Mares, scheduled on Oct 18th EGD:  Medical History:  Past Medical History:  Diagnosis Date  . Essential thrombocytosis ([place])   . Fibrocystic breast disease   . Fibroids    Uterine  . Hypertension   . Migraines   . Osteopenia    Allergies Allergies  Allergen Reactions  . Other     Cigarette smoke    SURGICAL HISTORY She  has a past surgical history that includes Breast surgery. FAMILY HISTORY Her family history includes Atrial fibrillation in her father; Cancer in her mother. SOCIAL HISTORY She  reports that she has never smoked. She does not have any smokeless tobacco history on file. She reports that she does not drink alcohol or use drugs.  Review of Systems  Constitutional: Negative.   HENT: Negative.   Eyes: Negative.   Respiratory: Negative.   Cardiovascular: Negative.   Gastrointestinal: Negative.   Genitourinary: Negative.   Musculoskeletal: Negative.   Skin: Negative.   Neurological: Negative.   Endo/Heme/Allergies: Negative.   Psychiatric/Behavioral: Negative.    Physical Exam: Estimated body mass index is 22.08 kg/m as calculated from the  following:   Height as of this encounter: 5\' 8"  (1.727 m).   Weight as of this encounter: 145 lb 3.2 oz (65.9 kg). BP 118/76   Pulse 86   Temp 97.5 F (36.4 C)   Resp 16   Ht 5\' 8"  (1.727 m)   Wt 145 lb 3.2 oz (65.9 kg)   SpO2 98%   BMI 22.08 kg/m  General Appearance: Well nourished, in no apparent distress. Eyes: PERRLA, EOMs, conjunctiva no  swelling or erythema, normal fundi and vessels. Sinuses: No Frontal/maxillary tenderness ENT/Mouth: Ext aud canals clear, normal light reflex with TMs without erythema, bulging, right TM crinkled, no erythema, swelling, etc..  Good dentition. No erythema, swelling, or exudate on post pharynx. Tonsils not swollen or erythematous. Hearing normal.  Neck: Supple, thyroid normal. No bruits Respiratory: Respiratory effort normal, BS equal bilaterally without rales, rhonchi, wheezing or stridor. Cardio: RRR without murmurs, rubs or gallops. Brisk peripheral pulses without edema.  Chest: symmetric, with normal excursions and percussion. Breasts: defer Abdomen: Soft, +BS. Non tender, no guarding, rebound, hernias, masses, or organomegaly. .  Lymphatics: Non tender without lymphadenopathy.  Genitourinary: defer Musculoskeletal: Full ROM all peripheral extremities,5/5 strength, and normal gait. Skin: Warm, dry without rashes, lesions, ecchymosis.  Neuro: Cranial nerves intact, reflexes equal bilaterally. Normal muscle tone, no cerebellar symptoms. Sensation intact.  Psych: Awake and oriented X 3, normal affect, Insight and Judgment appropriate.   EKG: defer  [name] 9:31 AM

## 2016-06-06 ENCOUNTER — Other Ambulatory Visit: Payer: Self-pay | Admitting: Physician Assistant

## 2016-06-06 DIAGNOSIS — Z13220 Encounter for screening for lipoid disorders: Secondary | ICD-10-CM

## 2016-06-06 LAB — URINALYSIS, ROUTINE W REFLEX MICROSCOPIC
Bilirubin Urine: NEGATIVE
Glucose, UA: NEGATIVE
Hgb urine dipstick: NEGATIVE
Ketones, ur: NEGATIVE
Leukocytes, UA: NEGATIVE
Nitrite: NEGATIVE
Protein, ur: NEGATIVE
Specific Gravity, Urine: 1.007 (ref 1.001–1.035)
pH: 7.5 (ref 5.0–8.0)

## 2016-06-06 LAB — MICROALBUMIN / CREATININE URINE RATIO
Creatinine, Urine: 25 mg/dL (ref 20–320)
Microalb Creat Ratio: 8 mcg/mg creat (ref <30)
Microalb, Ur: 0.2 mg/dL

## 2016-07-03 LAB — HM COLONOSCOPY

## 2016-08-17 ENCOUNTER — Encounter: Payer: Self-pay | Admitting: *Deleted

## 2016-12-17 ENCOUNTER — Encounter: Payer: Self-pay | Admitting: Physician Assistant

## 2016-12-17 ENCOUNTER — Ambulatory Visit (INDEPENDENT_AMBULATORY_CARE_PROVIDER_SITE_OTHER): Payer: BLUE CROSS/BLUE SHIELD | Admitting: Physician Assistant

## 2016-12-17 VITALS — BP 120/74 | HR 107 | Temp 98.1°F | Resp 16 | Ht 68.0 in | Wt 147.0 lb

## 2016-12-17 DIAGNOSIS — J0100 Acute maxillary sinusitis, unspecified: Secondary | ICD-10-CM | POA: Diagnosis not present

## 2016-12-17 MED ORDER — FLUTICASONE PROPIONATE 50 MCG/ACT NA SUSP: 2.0000 | Freq: Every day | NASAL | 1 refills | Status: DC

## 2016-12-17 MED ORDER — PROMETHAZINE-DM 6.25-15 MG/5ML PO SYRP
5.0000 mL | ORAL_SOLUTION | Freq: Four times a day (QID) | ORAL | 1 refills | Status: DC | PRN
Start: 2016-12-17 — End: 2017-07-18

## 2016-12-17 MED ORDER — PREDNISONE 20 MG PO TABS: ORAL_TABLET | ORAL | 0 refills | Status: DC

## 2016-12-17 MED ORDER — AZITHROMYCIN 250 MG PO TABS: ORAL_TABLET | ORAL | 1 refills | Status: AC

## 2016-12-17 NOTE — Progress Notes (Signed)
   Subjective:    Patient ID: Cynthia Lin, female    DOB: [birth year], 66 y.o.   MRN: [id number]  HPI 66 y.o. WF presents with 7 days body aches, ST, cough nonproductive, abnormal taste. Has used zicam nasal swabs.   Blood pressure 120/74, pulse (!) 107, temperature 98.1 F (36.7 C), resp. rate 16, height 5\' 8"  (1.727 m), weight 147 lb (66.7 kg), SpO2 99 %.  Medications Current Outpatient Prescriptions on File Prior to Visit  Medication Sig  . cholecalciferol (VITAMIN D) 400 UNITS TABS Take 400 Units by mouth daily.    . Multiple Vitamin (MULTIVITAMIN) capsule Take 1 capsule by mouth daily.     No current facility-administered medications on file prior to visit.     Problem list She has Osteopenia; Migraines; Fibroids; Hypertension; Thrombocytosis ([place]); Fibrocystic breast disease; Vitamin D deficiency; and Medication management on her problem list.   Review of Systems  Constitutional: Negative for chills and diaphoresis.  HENT: Positive for congestion, postnasal drip, sinus pressure and sneezing. Negative for ear pain and sore throat.   Respiratory: Positive for cough. Negative for chest tightness, shortness of breath and wheezing.   Cardiovascular: Negative.   Gastrointestinal: Negative.   Genitourinary: Negative.   Musculoskeletal: Negative for neck pain.  Neurological: Positive for headaches.       Objective:   Physical Exam  Constitutional: She appears well-developed and well-nourished.  HENT:  Head: Normocephalic and atraumatic.  Right Ear: External ear normal.  Nose: Right sinus exhibits maxillary sinus tenderness. Right sinus exhibits no frontal sinus tenderness. Left sinus exhibits maxillary sinus tenderness. Left sinus exhibits no frontal sinus tenderness.  Eyes: Conjunctivae and EOM are normal.  Neck: Normal range of motion. Neck supple.  Cardiovascular: Normal rate, regular rhythm, normal heart sounds and intact distal pulses.   Pulmonary/Chest: Effort  normal and breath sounds normal. No respiratory distress. She has no wheezes.  Abdominal: Soft. Bowel sounds are normal.  Lymphadenopathy:    She has no cervical adenopathy.  Skin: Skin is warm and dry.      Assessment & Plan:  1. Acute non-recurrent maxillary sinusitis Will hold the zpak and take if she is not getting better, increase fluids, rest, cont allergy pill - azithromycin (ZITHROMAX) 250 MG tablet; Take 2 tablets (500 mg) on  Day 1,  followed by 1 tablet (250 mg) once daily on Days 2 through 5.  Dispense: 6 each; Refill: 1 - predniSONE (DELTASONE) 20 MG tablet; 2 tablets daily for 3 days, 1 tablet daily for 4 days.  Dispense: 10 tablet; Refill: 0 - fluticasone (FLONASE) 50 MCG/ACT nasal spray; Place 2 sprays into both nostrils at bedtime.  Dispense: 16 g; Refill: 1 - promethazine-dextromethorphan (PROMETHAZINE-DM) 6.25-15 MG/5ML syrup; Take 5 mLs by mouth 4 (four) times daily as needed for cough.  Dispense: 240 mL; Refill: 1  The patient was advised to call immediately if she has any concerning symptoms in the interval. The patient voices understanding of current treatment options and is in agreement with the current care plan.The patient knows to call the clinic with any problems, questions or concerns or go to the ER if any further progression of symptoms.

## 2016-12-17 NOTE — Patient Instructions (Signed)
Make sure you are on an allergy pill, flonase and the prednisone, see below for more details. Please take the prednisone as directed below, this is NOT an antibiotic so you do NOT have to finish it. You can take it for a few days and stop it if you are doing better.   Please take the prednisone to help decrease inflammation and therefore decrease symptoms. Take it it with food to avoid GI upset. It can cause increased energy but on the other hand it can make it hard to sleep at night so please take it AT [place], it takes 8-12 hours to start working so it will NOT affect your sleeping if you take it at night with your food!!  If you are diabetic it will increase your sugars so decrease carbs and monitor your sugars closely.     HOW TO TREAT VIRAL COUGH AND COLD SYMPTOMS:  -Symptoms usually last at least 1 week with the worst symptoms being around day 4.  - colds usually start with a sore throat and end with a cough, and the cough can take 2 weeks to get better.  -No antibiotics are needed for colds, flu, sore throats, cough, bronchitis UNLESS symptoms are longer than 7 days OR if you are getting better then get drastically worse.  -There are a lot of combination medications (Dayquil, Nyquil, Vicks 44, tyelnol cold and sinus, ETC). Please look at the ingredients on the back so that you are treating the correct symptoms and not doubling up on medications/ingredients.    Medicines you can use  Nasal congestion  - pseudoephedrine (Sudafed)- behind the counter, do not use if you have high blood pressure, medicine that have -D in them.  - phenylephrine (Sudafed PE) -Dextormethorphan + chlorpheniramine (Coridcidin HBP)- okay if you have high blood pressure -Oxymetazoline (Afrin) nasal spray- LIMIT to 3 days -Saline nasal spray -Neti pot (used distilled or bottled water)  Ear pain/congestion  -pseudoephedrine (sudafed) - Nasonex/flonase nasal spray  Fever  -Acetaminophen  (Tyelnol) -Ibuprofen (Advil, motrin, aleve)  Sore Throat  -Acetaminophen (Tyelnol) -Ibuprofen (Advil, motrin, aleve) -Drink a lot of water -Gargle with salt water - Rest your voice (don't talk) -Throat sprays -Cough drops  Body Aches  -Acetaminophen (Tyelnol) -Ibuprofen (Advil, motrin, aleve)  Headache  -Acetaminophen (Tyelnol) -Ibuprofen (Advil, motrin, aleve) - Exedrin, Exedrin Migraine  Allergy symptoms (cough, sneeze, runny nose, itchy eyes) -Claritin or loratadine cheapest but likely the weakest  -Zyrtec or certizine at night because it can make you sleepy -The strongest is allegra or fexafinadine  Cheapest at [employer], [employer], [employer]  Cough  -Dextromethorphan (Delsym)- medicine that has DM in it -Guafenesin (Mucinex/Robitussin) - cough drops - drink lots of water  Chest Congestion  -Guafenesin (Mucinex/Robitussin)  Red Itchy Eyes  - Naphcon-A  Upset Stomach  - Bland diet (nothing spicy, greasy, fried, and high acid foods like tomatoes, oranges, berries) -OKAY- cereal, bread, soup, crackers, rice -Eat smaller more frequent meals -reduce caffeine, no alcohol -Loperamide (Imodium-AD) if diarrhea -Prevacid for heart burn  General health when sick  -Hydration -wash your hands frequently -keep surfaces clean -change pillow cases and sheets often -Get fresh air but do not exercise strenuously -Vitamin D, double up on it - Vitamin C -Zinc

## 2017-06-10 ENCOUNTER — Encounter: Payer: Self-pay | Admitting: Physician Assistant

## 2017-07-02 ENCOUNTER — Encounter: Payer: Self-pay | Admitting: Physician Assistant

## 2017-07-11 LAB — HM MAMMOGRAPHY

## 2017-07-11 LAB — HM DEXA SCAN

## 2017-07-15 ENCOUNTER — Encounter: Payer: Self-pay | Admitting: Internal Medicine

## 2017-07-18 ENCOUNTER — Encounter: Payer: Self-pay | Admitting: Physician Assistant

## 2017-07-18 ENCOUNTER — Ambulatory Visit (INDEPENDENT_AMBULATORY_CARE_PROVIDER_SITE_OTHER): Payer: BLUE CROSS/BLUE SHIELD | Admitting: Physician Assistant

## 2017-07-18 VITALS — BP 114/70 | HR 70 | Temp 97.5°F | Ht 68.0 in | Wt 146.8 lb

## 2017-07-18 DIAGNOSIS — Z136 Encounter for screening for cardiovascular disorders: Secondary | ICD-10-CM | POA: Diagnosis not present

## 2017-07-18 DIAGNOSIS — Z7184 Encounter for health counseling related to travel: Secondary | ICD-10-CM

## 2017-07-18 DIAGNOSIS — D473 Essential (hemorrhagic) thrombocythemia: Secondary | ICD-10-CM

## 2017-07-18 DIAGNOSIS — M8580 Other specified disorders of bone density and structure, unspecified site: Secondary | ICD-10-CM

## 2017-07-18 DIAGNOSIS — Z79899 Other long term (current) drug therapy: Secondary | ICD-10-CM

## 2017-07-18 DIAGNOSIS — E559 Vitamin D deficiency, unspecified: Secondary | ICD-10-CM

## 2017-07-18 DIAGNOSIS — I10 Essential (primary) hypertension: Secondary | ICD-10-CM

## 2017-07-18 DIAGNOSIS — M720 Palmar fascial fibromatosis [Dupuytren]: Secondary | ICD-10-CM

## 2017-07-18 DIAGNOSIS — Z0000 Encounter for general adult medical examination without abnormal findings: Secondary | ICD-10-CM | POA: Diagnosis not present

## 2017-07-18 DIAGNOSIS — D75839 Thrombocytosis, unspecified: Secondary | ICD-10-CM

## 2017-07-18 DIAGNOSIS — Z23 Encounter for immunization: Secondary | ICD-10-CM | POA: Diagnosis not present

## 2017-07-18 DIAGNOSIS — Z13220 Encounter for screening for lipoid disorders: Secondary | ICD-10-CM

## 2017-07-18 DIAGNOSIS — Z6822 Body mass index (BMI) 22.0-22.9, adult: Secondary | ICD-10-CM

## 2017-07-18 DIAGNOSIS — G43809 Other migraine, not intractable, without status migrainosus: Secondary | ICD-10-CM

## 2017-07-18 DIAGNOSIS — N6019 Diffuse cystic mastopathy of unspecified breast: Secondary | ICD-10-CM

## 2017-07-18 MED ORDER — SCOPOLAMINE 1 MG/3DAYS TD PT72: 1.0000 | MEDICATED_PATCH | TRANSDERMAL | 0 refills | Status: DC

## 2017-07-18 NOTE — Patient Instructions (Signed)

## 2017-07-18 NOTE — Progress Notes (Signed)
Complete Physical  Assessment and Plan: Essential hypertension - continue medications, DASH diet, exercise and monitor at home. Call if greater than 130/80.  - Urinalysis, Routine w reflex microscopic (not at [facility]) - Microalbumin / creatinine urine ratio  Osteopenia Osteopenia- get dexa 2 years, continue Vit D and Ca, weight bearing exercises -2.4 femur, discuss fosamax will wait at this time  Other migraine without status migrainosus, not intractable Remission/controlled  Uterine leiomyoma, unspecified location Better with menopause  Thrombocytosis Continue follow up  Fibrocystic breast disease, unspecified laterality Get 3 D MGM  Screening for blood or protein in urine - Urinalysis, Routine w reflex microscopic (not at [facility]) - Microalbumin / creatinine urine ratio  Vitamin D deficiency   Medication management  Needs flu shot -     Cancel: Flu vaccine HIGH DOSE PF -     FLU VACCINE MDCK QUAD W/Preservative  Dupuytren's contracture of left hand -     Ambulatory referral to Orthopedics  Travel advice encounter -     scopolamine (TRANSDERM-SCOP, 1.5 MG,) 1 MG/3DAYS; Place 1 patch (1.5 mg total) onto the skin every 3 (three) days.    Discussed med's effects and SE's. Screening labs and tests as requested with regular follow-up as recommended. HPI 66 y.o. female  presents for a complete physical. Her blood pressure has been controlled at home, today their BP is BP: 114/70  Has bump on left palm, no injury, x 2 months, plays cello.   Going on sailing trip with girlfriends [employer], would like patches.  She does workout, walks a lot. She denies chest pain, shortness of breath, dizziness.  She is not on cholesterol medication and denies myalgias. Her cholesterol is at goal. The cholesterol last visit was: Lab Results  Component Value Date   CHOL 173 06/06/2015   HDL 105 06/06/2015   LDLCALC 49 06/06/2015   TRIG 94 06/06/2015   CHOLHDL 1.6 06/06/2015   Last  A1C in the office was: Lab Results  Component Value Date   HGBA1C 5.6 06/06/2015   Patient is on Vitamin D supplement, 4000 IU daily.  Lab Results  Component Value Date   VD25OH 49 06/06/2015   BMI is Body mass index is 22.32 kg/m., she is working on diet and exercise. Wt Readings from Last 3 Encounters:  07/18/17 146 lb 12.8 oz (66.6 kg)  12/17/16 147 lb (66.7 kg)  06/05/16 145 lb 3.2 oz (65.9 kg)    Current Medications:  Current Outpatient Prescriptions on File Prior to Visit  Medication Sig Dispense Refill  . cholecalciferol (VITAMIN D) 400 UNITS TABS Take 400 Units by mouth daily.      . Multiple Vitamin (MULTIVITAMIN) capsule Take 1 capsule by mouth daily.       No current facility-administered medications on file prior to visit.    Health Maintenance:   Immunization History  Administered Date(s) Administered  . Td 11/07/2004  . Tdap 06/06/2015   Tetanus: 2016 Pneumovax: N/A Prevnar 13: due age 66 Flu vaccine: will get later Zostavax: N/A  Pap: 2016 negative HPV, normal test, never abnormal, does not need again 3DMGM: 07/11/2017  negative , dense breast DEXA: 03/2013, osteopenia t -1.7 due 2016 Colonoscopy: 07/03/2016 Dr. Collene Mares EGD:  Medical History:  Past Medical History:  Diagnosis Date  . Essential thrombocytosis ([place])   . Fibrocystic breast disease   . Fibroids    Uterine  . Hypertension   . Migraines   . Osteopenia    Allergies Allergies  Allergen Reactions  .  Other     Cigarette smoke    SURGICAL HISTORY She  has a past surgical history that includes Breast surgery. FAMILY HISTORY Her family history includes Atrial fibrillation in her father; Cancer in her mother. SOCIAL HISTORY She  reports that she has never smoked. She has never used smokeless tobacco. She reports that she does not drink alcohol or use drugs.  Review of Systems  Constitutional: Negative.   HENT: Negative.   Eyes: Negative.   Respiratory: Negative.    Cardiovascular: Negative.   Gastrointestinal: Negative.   Genitourinary: Negative.   Musculoskeletal: Negative.   Skin: Negative.   Neurological: Negative.   Endo/Heme/Allergies: Negative.   Psychiatric/Behavioral: Negative.    Physical Exam: Estimated body mass index is 22.32 kg/m as calculated from the following:   Height as of this encounter: 5\' 8"  (1.727 m).   Weight as of this encounter: 146 lb 12.8 oz (66.6 kg). BP 114/70   Pulse 70   Temp (!) 97.5 F (36.4 C)   Ht 5\' 8"  (1.727 m)   Wt 146 lb 12.8 oz (66.6 kg)   SpO2 98%   BMI 22.32 kg/m  General Appearance: Well nourished, in no apparent distress. Eyes: PERRLA, EOMs, conjunctiva no swelling or erythema, normal fundi and vessels. Sinuses: No Frontal/maxillary tenderness ENT/Mouth: Ext aud canals clear, normal light reflex with TMs without erythema, bulging, right TM crinkled, no erythema, swelling, etc..  Good dentition. No erythema, swelling, or exudate on post pharynx. Tonsils not swollen or erythematous. Hearing normal.  Neck: Supple, thyroid normal. No bruits Respiratory: Respiratory effort normal, BS equal bilaterally without rales, rhonchi, wheezing or stridor. Cardio: RRR without murmurs, rubs or gallops. Brisk peripheral pulses without edema.  Chest: symmetric, with normal excursions and percussion. Breasts: defer Abdomen: Soft, +BS. Non tender, no guarding, rebound, hernias, masses, or organomegaly. .  Lymphatics: Non tender without lymphadenopathy.  Genitourinary: defer Musculoskeletal: Full ROM all peripheral extremities,5/5 strength, and normal gait. Left palm with fibrosis, minimal restriction at this time.  Skin: Warm, dry without rashes, lesions, ecchymosis.  Neuro: Cranial nerves intact, reflexes equal bilaterally. Normal muscle tone, no cerebellar symptoms. Sensation intact.  Psych: Awake and oriented X 3, normal affect, Insight and Judgment appropriate.   EKG: defer  [name] 9:35 AM

## 2017-07-19 LAB — HEPATIC FUNCTION PANEL
AG Ratio: 1.6 (calc) (ref 1.0–2.5)
ALT: 20 U/L (ref 6–29)
AST: 23 U/L (ref 10–35)
Albumin: 4.3 g/dL (ref 3.6–5.1)
Alkaline phosphatase (APISO): 69 U/L (ref 33–130)
Bilirubin, Direct: 0.1 mg/dL (ref 0.0–0.2)
Globulin: 2.7 g/dL (calc) (ref 1.9–3.7)
Indirect Bilirubin: 0.4 mg/dL (calc) (ref 0.2–1.2)
Total Bilirubin: 0.5 mg/dL (ref 0.2–1.2)
Total Protein: 7 g/dL (ref 6.1–8.1)

## 2017-07-19 LAB — URINALYSIS, ROUTINE W REFLEX MICROSCOPIC
Bilirubin Urine: NEGATIVE
Glucose, UA: NEGATIVE
Hgb urine dipstick: NEGATIVE
Ketones, ur: NEGATIVE
Leukocytes, UA: NEGATIVE
Nitrite: NEGATIVE
Protein, ur: NEGATIVE
Specific Gravity, Urine: 1.019 (ref 1.001–1.03)
pH: 6.5 (ref 5.0–8.0)

## 2017-07-19 LAB — BASIC METABOLIC PANEL WITH GFR
BUN/Creatinine Ratio: 16 (calc) (ref 6–22)
BUN: 18 mg/dL (ref 7–25)
CO2: 27 mmol/L (ref 20–32)
Calcium: 9.5 mg/dL (ref 8.6–10.4)
Chloride: 103 mmol/L (ref 98–110)
Creat: 1.1 mg/dL — ABNORMAL HIGH (ref 0.50–0.99)
GFR, Est African American: 62 mL/min/{1.73_m2} (ref >=60)
GFR, Est Non African American: 53 mL/min/{1.73_m2} — ABNORMAL LOW (ref >=60)
Glucose, Bld: 94 mg/dL (ref 65–99)
Potassium: 4.3 mmol/L (ref 3.5–5.3)
Sodium: 140 mmol/L (ref 135–146)

## 2017-07-19 LAB — CBC WITH DIFFERENTIAL/PLATELET
Basophils Absolute: 52 cells/uL (ref 0–200)
Basophils Relative: 1.1 %
Eosinophils Absolute: 141 cells/uL (ref 15–500)
Eosinophils Relative: 3 %
HCT: 38.2 % (ref 35.0–45.0)
Hemoglobin: 13.2 g/dL (ref 11.7–15.5)
Lymphs Abs: 1363 cells/uL (ref 850–3900)
MCH: 31.5 pg (ref 27.0–33.0)
MCHC: 34.6 g/dL (ref 32.0–36.0)
MCV: 91.2 fL (ref 80.0–100.0)
MPV: 12.8 fL — ABNORMAL HIGH (ref 7.5–12.5)
Monocytes Relative: 12.7 %
Neutro Abs: 2547 cells/uL (ref 1500–7800)
Neutrophils Relative %: 54.2 %
Platelets: 76 10*3/uL — ABNORMAL LOW (ref 140–400)
RBC: 4.19 10*6/uL (ref 3.80–5.10)
RDW: 11.9 % (ref 11.0–15.0)
Total Lymphocyte: 29 %
WBC mixed population: 597 cells/uL (ref 200–950)
WBC: 4.7 10*3/uL (ref 3.8–10.8)

## 2017-07-19 LAB — LIPID PANEL
Cholesterol: 181 mg/dL (ref <200)
HDL: 111 mg/dL (ref >50)
LDL Cholesterol (Calc): 53 mg/dL (calc)
Non-HDL Cholesterol (Calc): 70 mg/dL (calc) (ref <130)
Total CHOL/HDL Ratio: 1.6 (calc) (ref <5.0)
Triglycerides: 91 mg/dL (ref <150)

## 2017-07-19 LAB — MICROALBUMIN / CREATININE URINE RATIO
Creatinine, Urine: 98 mg/dL (ref 20–275)
Microalb, Ur: <0.2 mg/dL

## 2017-07-19 LAB — TSH: TSH: 0.78 mIU/L (ref 0.40–4.50)

## 2017-07-19 LAB — MAGNESIUM: Magnesium: 2.2 mg/dL (ref 1.5–2.5)

## 2017-07-21 ENCOUNTER — Other Ambulatory Visit: Payer: Self-pay | Admitting: Physician Assistant

## 2017-07-21 DIAGNOSIS — D696 Thrombocytopenia, unspecified: Secondary | ICD-10-CM

## 2017-07-21 DIAGNOSIS — I10 Essential (primary) hypertension: Secondary | ICD-10-CM

## 2017-07-21 DIAGNOSIS — D75839 Thrombocytosis, unspecified: Secondary | ICD-10-CM

## 2017-07-21 DIAGNOSIS — D473 Essential (hemorrhagic) thrombocythemia: Secondary | ICD-10-CM

## 2017-12-31 ENCOUNTER — Other Ambulatory Visit: Payer: Self-pay | Admitting: Physician Assistant

## 2017-12-31 DIAGNOSIS — Z7184 Encounter for health counseling related to travel: Secondary | ICD-10-CM

## 2017-12-31 MED ORDER — SCOPOLAMINE 1 MG/3DAYS TD PT72: 1.0000 | MEDICATED_PATCH | TRANSDERMAL | 0 refills | Status: DC

## 2018-06-17 ENCOUNTER — Encounter: Payer: Self-pay | Admitting: Physician Assistant

## 2018-07-27 NOTE — Progress Notes (Signed)
Complete Physical  Assessment and Plan: Essential hypertension - continue medications, DASH diet, exercise and monitor at home. Call if greater than 130/80.  - Urinalysis, Routine w reflex microscopic (not at [facility]) - Microalbumin / creatinine urine ratio  Osteopenia Osteopenia- get dexa 2 years, continue Vit D and Ca, weight bearing exercises -2.4 femur, discussd fosamax   Other migraine without status migrainosus, not intractable Remission/controlled  Uterine leiomyoma, unspecified location Better with menopause  Thrombocytosis Continue follow up  Fibrocystic breast disease, unspecified laterality Get 3 D MGM  Screening for blood or protein in urine - Urinalysis, Routine w reflex microscopic (not at [facility]) - Microalbumin / creatinine urine ratio  Vitamin D deficiency   Medication management  Needs flu shot -     FLU VACCINE MDCK QUAD W/Preservative     Discussed med's effects and SE's. Screening labs and tests as requested with regular follow-up as recommended. HPI 67 y.o. female  presents for a complete physical. Her blood pressure has been controlled at home, today their BP is BP: 122/86  Has bump on left palm, no injury, plays cello.    She does workout, walks a lot. She denies chest pain, shortness of breath, dizziness.  She is not on cholesterol medication and denies myalgias. Her cholesterol is at goal. The cholesterol last visit was: Lab Results  Component Value Date   CHOL 181 07/18/2017   HDL 111 07/18/2017   LDLCALC 53 07/18/2017   TRIG 91 07/18/2017   CHOLHDL 1.6 07/18/2017   Last A1C in the office was: Lab Results  Component Value Date   HGBA1C 5.6 06/06/2015   Patient is on Vitamin D supplement, 4000 IU daily.  Lab Results  Component Value Date   VD25OH 49 06/06/2015   BMI is Body mass index is 22.66 kg/m., she is working on diet and exercise. Wt Readings from Last 3 Encounters:  07/31/18 149 lb (67.6 kg)  07/18/17 146 lb 12.8 oz (66.6  kg)  12/17/16 147 lb (66.7 kg)    Current Medications:  Current Outpatient Medications on File Prior to Visit  Medication Sig Dispense Refill  . cholecalciferol (VITAMIN D) 400 UNITS TABS Take 400 Units by mouth daily.      . Multiple Vitamin (MULTIVITAMIN) capsule Take 1 capsule by mouth daily.      Marland Kitchen scopolamine (TRANSDERM-SCOP, 1.5 MG,) 1 MG/3DAYS Place 1 patch (1.5 mg total) onto the skin every 3 (three) days. 4 patch 0   No current facility-administered medications on file prior to visit.    Health Maintenance:   Immunization History  Administered Date(s) Administered  . Influenza Inj Mdck Quad With Preservative 07/18/2017, 07/31/2018  . Td 11/07/2004  . Tdap 06/06/2015   Tetanus: 2016 Pneumovax: N/A Prevnar 13: due age 50 Flu vaccine: will get later Zostavax: N/A  Pap: 2016 negative HPV, normal test, never abnormal, does not need again 3DMGM: 07/11/2017  negative , dense breast DEXA: 06/2017, osteopenia t -2.3 Colonoscopy: 07/03/2016 Dr. Collene Mares EGD:  Medical History:  Past Medical History:  Diagnosis Date  . Essential thrombocytosis ([place])   . Fibrocystic breast disease   . Fibroids    Uterine  . Hypertension   . Migraines   . Osteopenia    Allergies Allergies  Allergen Reactions  . Other     Cigarette smoke    SURGICAL HISTORY She  has a past surgical history that includes Breast surgery. FAMILY HISTORY Her family history includes Atrial fibrillation in her father; Cancer in her mother. SOCIAL  HISTORY She  reports that she has never smoked. She has never used smokeless tobacco. She reports that she does not drink alcohol or use drugs.  Review of Systems  Constitutional: Negative.   HENT: Negative.   Eyes: Negative.   Respiratory: Negative.   Cardiovascular: Negative.   Gastrointestinal: Negative.   Genitourinary: Negative.   Musculoskeletal: Negative.   Skin: Negative.   Neurological: Negative.   Endo/Heme/Allergies: Negative.    Psychiatric/Behavioral: Negative.    Physical Exam: Estimated body mass index is 22.66 kg/m as calculated from the following:   Height as of this encounter: 5\' 8"  (1.727 m).   Weight as of this encounter: 149 lb (67.6 kg). BP 122/86   Pulse 69   Temp 97.9 F (36.6 C)   Resp 16   Ht 5\' 8"  (1.727 m)   Wt 149 lb (67.6 kg)   SpO2 98%   BMI 22.66 kg/m  General Appearance: Well nourished, in no apparent distress. Eyes: PERRLA, EOMs, conjunctiva no swelling or erythema, normal fundi and vessels. Sinuses: No Frontal/maxillary tenderness ENT/Mouth: Ext aud canals clear, normal light reflex with TMs without erythema, bulging, right TM crinkled, no erythema, swelling, etc..  Good dentition. No erythema, swelling, or exudate on post pharynx. Tonsils not swollen or erythematous. Hearing normal.  Neck: Supple, thyroid normal. No bruits Respiratory: Respiratory effort normal, BS equal bilaterally without rales, rhonchi, wheezing or stridor. Cardio: RRR without murmurs, rubs or gallops. Brisk peripheral pulses without edema.  Chest: symmetric, with normal excursions and percussion. Breasts: defer Abdomen: Soft, +BS. Non tender, no guarding, rebound, hernias, masses, or organomegaly. .  Lymphatics: Non tender without lymphadenopathy.  Genitourinary: defer Musculoskeletal: Full ROM all peripheral extremities,5/5 strength, and normal gait. Left palm with fibrosis, minimal restriction at this time.  Skin: Warm, dry without rashes, lesions, ecchymosis.  Neuro: Cranial nerves intact, reflexes equal bilaterally. Normal muscle tone, no cerebellar symptoms. Sensation intact.  Psych: Awake and oriented X 3, normal affect, Insight and Judgment appropriate.   EKG: defer  [name] 9:43 AM

## 2018-07-31 ENCOUNTER — Ambulatory Visit (INDEPENDENT_AMBULATORY_CARE_PROVIDER_SITE_OTHER): Payer: BLUE CROSS/BLUE SHIELD | Admitting: Physician Assistant

## 2018-07-31 ENCOUNTER — Encounter: Payer: Self-pay | Admitting: Physician Assistant

## 2018-07-31 VITALS — BP 122/86 | HR 69 | Temp 97.9°F | Resp 16 | Ht 68.0 in | Wt 149.0 lb

## 2018-07-31 DIAGNOSIS — Z1389 Encounter for screening for other disorder: Secondary | ICD-10-CM

## 2018-07-31 DIAGNOSIS — Z13220 Encounter for screening for lipoid disorders: Secondary | ICD-10-CM

## 2018-07-31 DIAGNOSIS — Z79899 Other long term (current) drug therapy: Secondary | ICD-10-CM

## 2018-07-31 DIAGNOSIS — Z1329 Encounter for screening for other suspected endocrine disorder: Secondary | ICD-10-CM | POA: Diagnosis not present

## 2018-07-31 DIAGNOSIS — Z0000 Encounter for general adult medical examination without abnormal findings: Secondary | ICD-10-CM

## 2018-07-31 DIAGNOSIS — Z23 Encounter for immunization: Secondary | ICD-10-CM | POA: Diagnosis not present

## 2018-07-31 DIAGNOSIS — D696 Thrombocytopenia, unspecified: Secondary | ICD-10-CM

## 2018-07-31 DIAGNOSIS — D219 Benign neoplasm of connective and other soft tissue, unspecified: Secondary | ICD-10-CM

## 2018-07-31 DIAGNOSIS — E559 Vitamin D deficiency, unspecified: Secondary | ICD-10-CM | POA: Diagnosis not present

## 2018-07-31 DIAGNOSIS — N6019 Diffuse cystic mastopathy of unspecified breast: Secondary | ICD-10-CM

## 2018-07-31 DIAGNOSIS — I10 Essential (primary) hypertension: Secondary | ICD-10-CM

## 2018-07-31 DIAGNOSIS — D75839 Thrombocytosis, unspecified: Secondary | ICD-10-CM

## 2018-07-31 DIAGNOSIS — D473 Essential (hemorrhagic) thrombocythemia: Secondary | ICD-10-CM

## 2018-07-31 DIAGNOSIS — G43809 Other migraine, not intractable, without status migrainosus: Secondary | ICD-10-CM

## 2018-07-31 DIAGNOSIS — M8580 Other specified disorders of bone density and structure, unspecified site: Secondary | ICD-10-CM

## 2018-07-31 NOTE — Patient Instructions (Signed)
VITAMIN D IS IMPORTANT  Vitamin D goal is between 60-80  Please make sure that you are taking your Vitamin D as directed.   It is very important as a natural anti-inflammatory   helping hair, skin, and nails, as well as reducing stroke and heart attack risk.   It helps your bones and helps with mood.  We want you on at least 5000 IU daily  It also decreases numerous cancer risks so please take it as directed.   Low Vit D is associated with a 200-300% higher risk for CANCER   and 200-300% higher risk for HEART   ATTACK  &  STROKE.    .....................................Marland Kitchen  It is also associated with higher death rate at younger ages,   autoimmune diseases like Rheumatoid arthritis, Lupus, Multiple Sclerosis.     Also many other serious conditions, like depression, Alzheimer's  Dementia, infertility, muscle aches, fatigue, fibromyalgia - just to name a few.  +++++++++++++++++++  Can get liquid vitamin D from [place] here in [place] at  [facility] alternatives [address], [place], [place] [address] Or you can try earth fare    Osteoporosis Osteoporosis is the thinning and loss of density in the bones. Osteoporosis makes the bones more brittle, fragile, and likely to break (fracture). Over time, osteoporosis can cause the bones to become so weak that they fracture after a simple fall. The bones most likely to fracture are the bones in the hip, wrist, and spine. What are the causes? The exact cause is not known. What increases the risk? Anyone can develop osteoporosis. You may be at greater risk if you have a family history of the condition or have poor nutrition. You may also have a higher risk if you are:  Female.  2 years old or older.  A smoker.  Not physically active.  White or Asian.  Slender.  What are the signs or symptoms? A fracture might be the first sign of the disease, especially if it results from a fall or injury that would not usually cause a  bone to break. Other signs and symptoms include:  Low back and neck pain.  Stooped posture.  Height loss.  How is this diagnosed? To make a diagnosis, your health care provider may:  Take a medical history.  Perform a physical exam.  Order tests, such as: ? A bone mineral density test. ? A dual-energy X-ray absorptiometry test.  How is this treated? The goal of osteoporosis treatment is to strengthen your bones to reduce your risk of a fracture. Treatment may involve:  Making lifestyle changes, such as: ? Eating a diet rich in calcium. ? Doing weight-bearing and muscle-strengthening exercises. ? Stopping tobacco use. ? Limiting alcohol intake.  Taking medicine to slow the process of bone loss or to increase bone density.  Monitoring your levels of calcium and vitamin D.  Follow these instructions at home:  Include calcium and vitamin D in your diet. Calcium is important for bone health, and vitamin D helps the body absorb calcium.  Perform weight-bearing and muscle-strengthening exercises as directed by your health care provider.  Do not use any tobacco products, including cigarettes, chewing tobacco, and electronic cigarettes. If you need help quitting, ask your health care provider.  Limit your alcohol intake.  Take medicines only as directed by your health care provider.  Keep all follow-up visits as directed by your health care provider. This is important.  Take precautions at home to lower your risk of falling,  such as: ? Keeping rooms well lit and clutter free. ? Installing safety rails on stairs. ? Using rubber mats in the bathroom and other areas that are often wet or slippery. Get help right away if: You fall or injure yourself. This information is not intended to replace advice given to you by your health care provider. Make sure you discuss any questions you have with your health care provider. Document Released: 06/12/2005 Document Revised: 02/05/2016  Document Reviewed: 02/10/2014 Elsevier Interactive Patient Education  [employer].

## 2018-08-01 LAB — CBC WITH DIFFERENTIAL/PLATELET
Basophils Absolute: 60 cells/uL (ref 0–200)
Basophils Relative: 1.2 %
Eosinophils Absolute: 130 cells/uL (ref 15–500)
Eosinophils Relative: 2.6 %
HCT: 38.1 % (ref 35.0–45.0)
Hemoglobin: 12.9 g/dL (ref 11.7–15.5)
Lymphs Abs: 1430 cells/uL (ref 850–3900)
MCH: 31.2 pg (ref 27.0–33.0)
MCHC: 33.9 g/dL (ref 32.0–36.0)
MCV: 92.3 fL (ref 80.0–100.0)
MPV: 12.7 fL — ABNORMAL HIGH (ref 7.5–12.5)
Monocytes Relative: 9.9 %
Neutro Abs: 2885 cells/uL (ref 1500–7800)
Neutrophils Relative %: 57.7 %
Platelets: 135 10*3/uL — ABNORMAL LOW (ref 140–400)
RBC: 4.13 10*6/uL (ref 3.80–5.10)
RDW: 11.9 % (ref 11.0–15.0)
Total Lymphocyte: 28.6 %
WBC mixed population: 495 cells/uL (ref 200–950)
WBC: 5 10*3/uL (ref 3.8–10.8)

## 2018-08-01 LAB — COMPLETE METABOLIC PANEL WITH GFR
AG Ratio: 1.5 (calc) (ref 1.0–2.5)
ALT: 15 U/L (ref 6–29)
AST: 19 U/L (ref 10–35)
Albumin: 4.2 g/dL (ref 3.6–5.1)
Alkaline phosphatase (APISO): 63 U/L (ref 33–130)
BUN: 17 mg/dL (ref 7–25)
CO2: 27 mmol/L (ref 20–32)
Calcium: 9.5 mg/dL (ref 8.6–10.4)
Chloride: 104 mmol/L (ref 98–110)
Creat: 0.93 mg/dL (ref 0.50–0.99)
GFR, Est African American: 75 mL/min/{1.73_m2} (ref >=60)
GFR, Est Non African American: 65 mL/min/{1.73_m2} (ref >=60)
Globulin: 2.8 g/dL (calc) (ref 1.9–3.7)
Glucose, Bld: 98 mg/dL (ref 65–99)
Potassium: 4.2 mmol/L (ref 3.5–5.3)
Sodium: 140 mmol/L (ref 135–146)
Total Bilirubin: 0.5 mg/dL (ref 0.2–1.2)
Total Protein: 7 g/dL (ref 6.1–8.1)

## 2018-08-01 LAB — URINALYSIS, ROUTINE W REFLEX MICROSCOPIC
Bilirubin Urine: NEGATIVE
Glucose, UA: NEGATIVE
Hgb urine dipstick: NEGATIVE
Ketones, ur: NEGATIVE
Leukocytes, UA: NEGATIVE
Nitrite: NEGATIVE
Protein, ur: NEGATIVE
Specific Gravity, Urine: 1.016 (ref 1.001–1.03)
pH: 6 (ref 5.0–8.0)

## 2018-08-01 LAB — MICROALBUMIN / CREATININE URINE RATIO
Creatinine, Urine: 58 mg/dL (ref 20–275)
Microalb, Ur: <0.2 mg/dL

## 2018-08-01 LAB — LIPID PANEL
Cholesterol: 182 mg/dL (ref <200)
HDL: 99 mg/dL (ref >50)
LDL Cholesterol (Calc): 67 mg/dL (calc)
Non-HDL Cholesterol (Calc): 83 mg/dL (calc) (ref <130)
Total CHOL/HDL Ratio: 1.8 (calc) (ref <5.0)
Triglycerides: 82 mg/dL (ref <150)

## 2018-08-01 LAB — VITAMIN D 25 HYDROXY (VIT D DEFICIENCY, FRACTURES): Vit D, 25-Hydroxy: 68 ng/mL (ref 30–100)

## 2018-08-01 LAB — MAGNESIUM: Magnesium: 2 mg/dL (ref 1.5–2.5)

## 2018-08-01 LAB — TSH: TSH: 0.82 mIU/L (ref 0.40–4.50)

## 2018-10-06 LAB — HM MAMMOGRAPHY

## 2018-11-03 ENCOUNTER — Encounter: Payer: Self-pay | Admitting: *Deleted

## 2019-08-02 NOTE — Progress Notes (Deleted)
Complete Physical  Assessment and Plan:  Discussed med's effects and SE's. Screening labs and tests as requested with regular follow-up as recommended. HPI 68 y.o. female  presents for a complete physical. Her blood pressure has been controlled at home, today their BP is    Has bump on left palm, no injury, plays cello.    BMI is There is no height or weight on file to calculate BMI., she is working on diet and exercise. Wt Readings from Last 3 Encounters:  07/31/18 149 lb (67.6 kg)  07/18/17 146 lb 12.8 oz (66.6 kg)  12/17/16 147 lb (66.7 kg)   She does workout, walks a lot. She denies chest pain, shortness of breath, dizziness.  She is not on cholesterol medication and denies myalgias. Her cholesterol is at goal. The cholesterol last visit was: Lab Results  Component Value Date   CHOL 182 07/31/2018   HDL 99 07/31/2018   LDLCALC 67 07/31/2018   TRIG 82 07/31/2018   CHOLHDL 1.8 07/31/2018   Last A1C in the office was: Lab Results  Component Value Date   HGBA1C 5.6 06/06/2015   Patient is on Vitamin D supplement, 4000 IU daily.  Lab Results  Component Value Date   VD25OH 68 07/31/2018    Current Medications:  Current Outpatient Medications on File Prior to Visit  Medication Sig Dispense Refill  . cholecalciferol (VITAMIN D) 400 UNITS TABS Take 400 Units by mouth daily.      . Multiple Vitamin (MULTIVITAMIN) capsule Take 1 capsule by mouth daily.      Marland Kitchen scopolamine (TRANSDERM-SCOP, 1.5 MG,) 1 MG/3DAYS Place 1 patch (1.5 mg total) onto the skin every 3 (three) days. 4 patch 0   No current facility-administered medications on file prior to visit.    Health Maintenance:   Immunization History  Administered Date(s) Administered  . Influenza Inj Mdck Quad With Preservative 07/18/2017, 07/31/2018  . Td 11/07/2004  . Tdap 06/06/2015   Tetanus: 2016 Pneumovax: N/A Prevnar 13: due age 67 Flu vaccine: will get later Zostavax: N/A  Pap: 2016 negative HPV, normal test,  never abnormal, does not need again 3DMGM: 07/11/2017  negative , dense breast DEXA: 06/2017, osteopenia t -2.3 Colonoscopy: 07/03/2016 Dr. Collene Mares EGD:  Medical History:  Past Medical History:  Diagnosis Date  . Essential thrombocytosis ([place])   . Fibrocystic breast disease   . Fibroids    Uterine  . Hypertension   . Migraines   . Osteopenia    Allergies Allergies  Allergen Reactions  . Other     Cigarette smoke    SURGICAL HISTORY She  has a past surgical history that includes Breast surgery. FAMILY HISTORY Her family history includes Atrial fibrillation in her father; Cancer in her mother. SOCIAL HISTORY She  reports that she has never smoked. She has never used smokeless tobacco. She reports that she does not drink alcohol or use drugs.  Review of Systems  Constitutional: Negative.   HENT: Negative.   Eyes: Negative.   Respiratory: Negative.   Cardiovascular: Negative.   Gastrointestinal: Negative.   Genitourinary: Negative.   Musculoskeletal: Negative.   Skin: Negative.   Neurological: Negative.   Endo/Heme/Allergies: Negative.   Psychiatric/Behavioral: Negative.    Physical Exam: Estimated body mass index is 22.66 kg/m as calculated from the following:   Height as of 07/31/18: 5\' 8"  (1.727 m).   Weight as of 07/31/18: 149 lb (67.6 kg). There were no vitals taken for this visit. General Appearance: Well nourished, in no  apparent distress. Eyes: PERRLA, EOMs, conjunctiva no swelling or erythema, normal fundi and vessels. Sinuses: No Frontal/maxillary tenderness ENT/Mouth: Ext aud canals clear, normal light reflex with TMs without erythema, bulging, right TM crinkled, no erythema, swelling, etc..  Good dentition. No erythema, swelling, or exudate on post pharynx. Tonsils not swollen or erythematous. Hearing normal.  Neck: Supple, thyroid normal. No bruits Respiratory: Respiratory effort normal, BS equal bilaterally without rales, rhonchi, wheezing or  stridor. Cardio: RRR without murmurs, rubs or gallops. Brisk peripheral pulses without edema.  Chest: symmetric, with normal excursions and percussion. Breasts: defer Abdomen: Soft, +BS. Non tender, no guarding, rebound, hernias, masses, or organomegaly. .  Lymphatics: Non tender without lymphadenopathy.  Genitourinary: defer Musculoskeletal: Full ROM all peripheral extremities,5/5 strength, and normal gait. Left palm with fibrosis, minimal restriction at this time.  Skin: Warm, dry without rashes, lesions, ecchymosis.  Neuro: Cranial nerves intact, reflexes equal bilaterally. Normal muscle tone, no cerebellar symptoms. Sensation intact.  Psych: Awake and oriented X 3, normal affect, Insight and Judgment appropriate.   EKG: defer  [name] 1:28 PM

## 2019-08-04 NOTE — Progress Notes (Signed)
WELCOME TO MEDICARE  Assessment and Plan:  Encounter for Medicare annual wellness exam 1 year  Thrombocytopenia ([place]) -     CBC with Diff - continue to monitor  Medication management -     CBC with Diff -     COMPLETE METABOLIC PANEL WITH GFR -     TSH -     Magnesium  Essential hypertension -     TSH -     EKG 12-Lead - continue medications, DASH diet, exercise and monitor at home. Call if greater than 130/80.   Vitamin D deficiency -     Vitamin D (25 hydroxy)  Fibrocystic breast disease ([facility]), unspecified laterality  Osteopenia, unspecified location -     DG Bone Density; Future  Other migraine without status migrainosus, not intractable Controlled  Screening cholesterol level -     Lipid Profile  Screening for hematuria or proteinuria -     Urinalysis, Routine w reflex microscopic -     Microalbumin / Creatinine Urine Ratio  Needs flu shot -     Flu vaccine HIGH DOSE PF (Fluzone High dose)  Need for vaccination against Streptococcus pneumoniae -     Pneumococcal conjugate vaccine 13-valent  Bilateral hand pain -     Rheumatoid factor -     Sedimentation rate - LIKELY OA, will check RF with distribution    Discussed med's effects and SE's. Screening labs and tests as requested with regular follow-up as recommended. Future Appointments  Date Time Provider [place]  08/07/2020  3:00 PM Cynthia Mutters, PA-C [id number] None    HPI 68 y.o. female  presents for a complete physical. Her blood pressure has been controlled at home, today their BP is BP: 122/84  Has bump on left palm, no injury, has pain bilateral MCP with swelling plays cello.   Husband with parkinson's  BMI is Body mass index is 20.77 kg/m., she is working on diet and exercise. She cut out wine.  Wt Readings from Last 3 Encounters:  08/05/19 136 lb 9.6 oz (62 kg)  07/31/18 149 lb (67.6 kg)  07/18/17 146 lb 12.8 oz (66.6 kg)   She does workout, walks a lot. She denies chest  pain, shortness of breath, dizziness.  She is not on cholesterol medication and denies myalgias. Her cholesterol is at goal. The cholesterol last visit was: Lab Results  Component Value Date   CHOL 177 08/05/2019   HDL 95 08/05/2019   LDLCALC 67 08/05/2019   TRIG 70 08/05/2019   CHOLHDL 1.9 08/05/2019   Last A1C in the office was: Lab Results  Component Value Date   HGBA1C 5.6 06/06/2015   Patient is on Vitamin D supplement, 4000 IU daily.  Lab Results  Component Value Date   VD25OH 73 08/05/2019    Current Medications:  Current Outpatient Medications on File Prior to Visit  Medication Sig Dispense Refill  . Ascorbic Acid (VITAMIN C) 1000 MG tablet Take 1,000 mg by mouth daily.     . cholecalciferol (VITAMIN D) 400 UNITS TABS Take 400 Units by mouth daily.      . Glucosamine-Chondroit-Vit C-Mn (GLUCOSAMINE 1500 COMPLEX PO) Take by mouth.    . Multiple Vitamin (MULTIVITAMIN) capsule Take 1 capsule by mouth daily.      Marland Kitchen scopolamine (TRANSDERM-SCOP, 1.5 MG,) 1 MG/3DAYS Place 1 patch (1.5 mg total) onto the skin every 3 (three) days. 4 patch 0   No current facility-administered medications on file prior to visit.  Health Maintenance:   Immunization History  Administered Date(s) Administered  . Influenza Inj Mdck Quad With Preservative 07/18/2017, 07/31/2018  . Influenza, High Dose Seasonal PF 08/05/2019  . Pneumococcal Conjugate-13 08/05/2019  . Td 11/07/2004  . Tdap 06/06/2015   Tetanus: 2016 Pneumovax: N/A Prevnar 13: due age 31 Flu vaccine: will get later Zostavax: N/A  Pap: 2016 negative HPV, normal test, never abnormal, does not need again 3DMGM: 06/2018- scheduled next month  negative , dense breast DEXA: 06/2017, osteopenia t -2.3 Colonoscopy: 07/03/2016 Dr. Collene Mares EGD:  Medical History:  Past Medical History:  Diagnosis Date  . Essential thrombocytosis ([place])   . Fibrocystic breast disease   . Fibroids    Uterine  . Hypertension   . Migraines   .  Osteopenia    Allergies Allergies  Allergen Reactions  . Other     Cigarette smoke    SURGICAL HISTORY She  has a past surgical history that includes Breast surgery. FAMILY HISTORY Her family history includes Atrial fibrillation in her father; Cancer in her mother. SOCIAL HISTORY She  reports that she has never smoked. She has never used smokeless tobacco. She reports that she does not drink alcohol or use drugs.  MEDICARE WELLNESS OBJECTIVES: Physical activity: Current Exercise Habits: Home exercise routine, Type of exercise: walking, Intensity: Moderate Cardiac risk factors: Cardiac Risk Factors include: advanced age (>29men, >43 women);hypertension Depression/mood screen:   No flowsheet data found.  ADLs:  In your present state of health, do you have any difficulty performing the following activities: 08/05/2019  Hearing? N  Vision? N  Difficulty concentrating or making decisions? N  Walking or climbing stairs? N  Dressing or bathing? N  Doing errands, shopping? N  Some recent data might be hidden     Cognitive Testing  Alert? Yes  Normal Appearance?Yes  Oriented to person? Yes  Place? Yes   Time? Yes  Recall of three objects?  Yes  Can perform simple calculations? Yes  Displays appropriate judgment?Yes  Can read the correct time from a watch face?Yes  EOL planning: Does Patient Have a Medical Advance Directive?: Yes Type of Advance Directive: Healthcare Power of Attorney, Living will Does patient want to make changes to medical advance directive?: No - Patient declined   Review of Systems  Constitutional: Negative.   HENT: Negative.   Eyes: Negative.   Respiratory: Negative.   Cardiovascular: Negative.   Gastrointestinal: Negative.   Genitourinary: Negative.   Musculoskeletal: Negative.   Skin: Negative.   Neurological: Negative.   Endo/Heme/Allergies: Negative.   Psychiatric/Behavioral: Negative.    Physical Exam: Estimated body mass index is 20.77  kg/m as calculated from the following:   Height as of this encounter: 5\' 8"  (1.727 m).   Weight as of this encounter: 136 lb 9.6 oz (62 kg). BP 122/84   Pulse 64   Temp (!) 97.5 F (36.4 C)   Ht 5\' 8"  (1.727 m)   Wt 136 lb 9.6 oz (62 kg)   SpO2 97%   BMI 20.77 kg/m  General Appearance: Well nourished, in no apparent distress. Eyes: PERRLA, EOMs, conjunctiva no swelling or erythema, normal fundi and vessels. Sinuses: No Frontal/maxillary tenderness ENT/Mouth: Ext aud canals clear, normal light reflex with TMs without erythema, bulging, right TM crinkled, no erythema, swelling, etc..  Good dentition. No erythema, swelling, or exudate on post pharynx. Tonsils not swollen or erythematous. Hearing normal.  Neck: Supple, thyroid normal. No bruits Respiratory: Respiratory effort normal, BS equal bilaterally without rales, rhonchi,  wheezing or stridor. Cardio: RRR without murmurs, rubs or gallops. Brisk peripheral pulses without edema.  Chest: symmetric, with normal excursions and percussion. Breasts: defer Abdomen: Soft, +BS. Non tender, no guarding, rebound, hernias, masses, or organomegaly. .  Lymphatics: Non tender without lymphadenopathy.  Genitourinary: defer Musculoskeletal: Full ROM all peripheral extremities,5/5 strength, and normal gait. Left palm with fibrosis, minimal restriction at this time.  Skin: Warm, dry without rashes, lesions, ecchymosis.  Neuro: Cranial nerves intact, reflexes equal bilaterally. Normal muscle tone, no cerebellar symptoms. Sensation intact.  Psych: Awake and oriented X 3, normal affect, Insight and Judgment appropriate.   EKG: WNL   Medicare Attestation I have personally reviewed: The patient's medical and social history Their use of alcohol, tobacco or illicit drugs Their current medications and supplements The patient's functional ability including ADLs,fall risks, home safety risks, cognitive, and hearing and visual impairment Diet and physical  activities Evidence for depression or mood disorders  The patient's weight, height, BMI, and visual acuity have been recorded in the chart.  I have made referrals, counseling, and provided education to the patient based on review of the above and I have provided the patient with a written personalized care plan for preventive services.     Cynthia Lin 12:19 PM

## 2019-08-05 ENCOUNTER — Encounter: Payer: Self-pay | Admitting: Physician Assistant

## 2019-08-05 ENCOUNTER — Other Ambulatory Visit: Payer: Self-pay

## 2019-08-05 ENCOUNTER — Ambulatory Visit (INDEPENDENT_AMBULATORY_CARE_PROVIDER_SITE_OTHER): Payer: Medicare Other | Admitting: Physician Assistant

## 2019-08-05 VITALS — BP 122/84 | HR 64 | Temp 97.5°F | Ht 68.0 in | Wt 136.6 lb

## 2019-08-05 DIAGNOSIS — M8580 Other specified disorders of bone density and structure, unspecified site: Secondary | ICD-10-CM

## 2019-08-05 DIAGNOSIS — E559 Vitamin D deficiency, unspecified: Secondary | ICD-10-CM | POA: Diagnosis not present

## 2019-08-05 DIAGNOSIS — G43809 Other migraine, not intractable, without status migrainosus: Secondary | ICD-10-CM

## 2019-08-05 DIAGNOSIS — I10 Essential (primary) hypertension: Secondary | ICD-10-CM | POA: Diagnosis not present

## 2019-08-05 DIAGNOSIS — Z0000 Encounter for general adult medical examination without abnormal findings: Secondary | ICD-10-CM

## 2019-08-05 DIAGNOSIS — N6019 Diffuse cystic mastopathy of unspecified breast: Secondary | ICD-10-CM | POA: Diagnosis not present

## 2019-08-05 DIAGNOSIS — Z79899 Other long term (current) drug therapy: Secondary | ICD-10-CM

## 2019-08-05 DIAGNOSIS — D696 Thrombocytopenia, unspecified: Secondary | ICD-10-CM

## 2019-08-05 DIAGNOSIS — M79642 Pain in left hand: Secondary | ICD-10-CM

## 2019-08-05 DIAGNOSIS — Z136 Encounter for screening for cardiovascular disorders: Secondary | ICD-10-CM

## 2019-08-05 DIAGNOSIS — Z0001 Encounter for general adult medical examination with abnormal findings: Secondary | ICD-10-CM

## 2019-08-05 DIAGNOSIS — R6889 Other general symptoms and signs: Secondary | ICD-10-CM

## 2019-08-05 DIAGNOSIS — Z13220 Encounter for screening for lipoid disorders: Secondary | ICD-10-CM

## 2019-08-05 DIAGNOSIS — Z23 Encounter for immunization: Secondary | ICD-10-CM | POA: Diagnosis not present

## 2019-08-05 DIAGNOSIS — M79641 Pain in right hand: Secondary | ICD-10-CM

## 2019-08-05 DIAGNOSIS — Z1389 Encounter for screening for other disorder: Secondary | ICD-10-CM

## 2019-08-05 NOTE — Patient Instructions (Signed)
TUMERIC FOR INFLAMMATION  Tumeric with black pepper extract is a great natural antiinflammatory that helps with arthritis and aches and pain. Can get from [employer] or any health food store. Need to take at least 800mg  twice a day with food TO [place].   Know what a healthy weight is for you (roughly BMI <25) and aim to maintain this  Aim for 7+ servings of fruits and vegetables daily  65-80+ fluid ounces of water or unsweet tea for healthy kidneys  Limit to max 1 drink of alcohol per day; avoid smoking/tobacco  Limit animal fats in diet for cholesterol and heart health - choose grass fed whenever available  Avoid highly processed foods, and foods high in saturated/trans fats  Aim for low stress - take time to unwind and care for your mental health  Aim for 150 min of moderate intensity exercise weekly for heart health, and weights twice weekly for bone health  Aim for 7-9 hours of sleep daily

## 2019-08-06 LAB — CBC WITH DIFFERENTIAL/PLATELET
Absolute Monocytes: 392 cells/uL (ref 200–950)
Basophils Absolute: 50 cells/uL (ref 0–200)
Basophils Relative: 1.1 %
Eosinophils Absolute: 149 cells/uL (ref 15–500)
Eosinophils Relative: 3.3 %
HCT: 37 % (ref 35.0–45.0)
Hemoglobin: 12.5 g/dL (ref 11.7–15.5)
Lymphs Abs: 1458 cells/uL (ref 850–3900)
MCH: 30.9 pg (ref 27.0–33.0)
MCHC: 33.8 g/dL (ref 32.0–36.0)
MCV: 91.6 fL (ref 80.0–100.0)
MPV: 12.4 fL (ref 7.5–12.5)
Monocytes Relative: 8.7 %
Neutro Abs: 2453 cells/uL (ref 1500–7800)
Neutrophils Relative %: 54.5 %
Platelets: 130 10*3/uL — ABNORMAL LOW (ref 140–400)
RBC: 4.04 10*6/uL (ref 3.80–5.10)
RDW: 12 % (ref 11.0–15.0)
Total Lymphocyte: 32.4 %
WBC: 4.5 10*3/uL (ref 3.8–10.8)

## 2019-08-06 LAB — COMPLETE METABOLIC PANEL WITH GFR
AG Ratio: 2 (calc) (ref 1.0–2.5)
ALT: 15 U/L (ref 6–29)
AST: 19 U/L (ref 10–35)
Albumin: 4.3 g/dL (ref 3.6–5.1)
Alkaline phosphatase (APISO): 59 U/L (ref 37–153)
BUN: 14 mg/dL (ref 7–25)
CO2: 27 mmol/L (ref 20–32)
Calcium: 9.4 mg/dL (ref 8.6–10.4)
Chloride: 104 mmol/L (ref 98–110)
Creat: 0.85 mg/dL (ref 0.50–0.99)
GFR, Est African American: 83 mL/min/{1.73_m2} (ref >=60)
GFR, Est Non African American: 72 mL/min/{1.73_m2} (ref >=60)
Globulin: 2.2 g/dL (calc) (ref 1.9–3.7)
Glucose, Bld: 92 mg/dL (ref 65–99)
Potassium: 4.2 mmol/L (ref 3.5–5.3)
Sodium: 140 mmol/L (ref 135–146)
Total Bilirubin: 0.4 mg/dL (ref 0.2–1.2)
Total Protein: 6.5 g/dL (ref 6.1–8.1)

## 2019-08-06 LAB — MAGNESIUM: Magnesium: 2 mg/dL (ref 1.5–2.5)

## 2019-08-06 LAB — MICROALBUMIN / CREATININE URINE RATIO
Creatinine, Urine: 77 mg/dL (ref 20–275)
Microalb Creat Ratio: 4 mcg/mg creat (ref <30)
Microalb, Ur: 0.3 mg/dL

## 2019-08-06 LAB — URINALYSIS, ROUTINE W REFLEX MICROSCOPIC
Bilirubin Urine: NEGATIVE
Glucose, UA: NEGATIVE
Hgb urine dipstick: NEGATIVE
Ketones, ur: NEGATIVE
Leukocytes,Ua: NEGATIVE
Nitrite: NEGATIVE
Protein, ur: NEGATIVE
Specific Gravity, Urine: 1.017 (ref 1.001–1.03)
pH: 6 (ref 5.0–8.0)

## 2019-08-06 LAB — VITAMIN D 25 HYDROXY (VIT D DEFICIENCY, FRACTURES): Vit D, 25-Hydroxy: 73 ng/mL (ref 30–100)

## 2019-08-06 LAB — RHEUMATOID FACTOR: Rheumatoid fact SerPl-aCnc: <14 IU/mL (ref <14)

## 2019-08-06 LAB — LIPID PANEL
Cholesterol: 177 mg/dL (ref <200)
HDL: 95 mg/dL (ref >=50)
LDL Cholesterol (Calc): 67 mg/dL (calc)
Non-HDL Cholesterol (Calc): 82 mg/dL (calc) (ref <130)
Total CHOL/HDL Ratio: 1.9 (calc) (ref <5.0)
Triglycerides: 70 mg/dL (ref <150)

## 2019-08-06 LAB — SEDIMENTATION RATE: Sed Rate: 6 mm/h (ref 0–30)

## 2019-08-06 LAB — TSH: TSH: 1.06 mIU/L (ref 0.40–4.50)

## 2019-08-10 ENCOUNTER — Telehealth: Payer: Self-pay | Admitting: Physician Assistant

## 2019-08-10 NOTE — Telephone Encounter (Signed)
faxed order for DEXA to [facility]

## 2019-08-10 NOTE — Progress Notes (Signed)
08/10/2019--patient is aware of lab results. - e welch

## 2019-08-10 NOTE — Telephone Encounter (Signed)
-----   Message from [name], [place] sent at 08/05/2019  3:58 PM EST ----- Dexa at [facility]

## 2019-10-12 LAB — HM DEXA SCAN

## 2019-10-13 LAB — HM MAMMOGRAPHY

## 2019-10-18 ENCOUNTER — Encounter: Payer: Self-pay | Admitting: Internal Medicine

## 2019-11-07 ENCOUNTER — Ambulatory Visit: Payer: Medicare Other | Attending: Internal Medicine

## 2019-11-07 DIAGNOSIS — Z23 Encounter for immunization: Secondary | ICD-10-CM | POA: Insufficient documentation

## 2019-11-07 NOTE — Progress Notes (Signed)
   Covid-19 Vaccination Clinic  Name:  Cynthia Lin    MRN: [id number] DOB: 08/22/1951  11/07/2019  [name] was observed post Covid-19 immunization for 15 minutes without incidence. She was provided with Vaccine Information Sheet and instruction to access the V-Safe system.   [name] was instructed to call 911 with any severe reactions post vaccine: Marland Kitchen Difficulty breathing  . Swelling of your face and throat  . A fast heartbeat  . A bad rash all over your body  . Dizziness and weakness    Immunizations Administered    Name Date Dose VIS Date Route   Pfizer COVID-19 Vaccine 11/07/2019 11:31 AM 0.3 mL 08/27/2019 Intramuscular   Manufacturer: [place]   Lot: [id number]   [place]: [id number]

## 2019-12-01 ENCOUNTER — Ambulatory Visit: Payer: Medicare Other | Attending: Internal Medicine

## 2019-12-01 DIAGNOSIS — Z23 Encounter for immunization: Secondary | ICD-10-CM

## 2019-12-01 NOTE — Progress Notes (Signed)
   Covid-19 Vaccination Clinic  Name:  SHAMARIA WERKING    MRN: [id number] DOB: 08-Feb-1951  12/01/2019  Ms. Maltese was observed post Covid-19 immunization for 15 minutes without incident. She was provided with Vaccine Information Sheet and instruction to access the V-Safe system.   Ms. Kwiatek was instructed to call 911 with any severe reactions post vaccine: Marland Kitchen Difficulty breathing  . Swelling of face and throat  . A fast heartbeat  . A bad rash all over body  . Dizziness and weakness   Immunizations Administered    Name Date Dose VIS Date Route   Pfizer COVID-19 Vaccine 12/01/2019  8:43 AM 0.3 mL 08/27/2019 Intramuscular   Manufacturer: [place]   Lot: [id number]   [place]: [id number]

## 2020-08-04 ENCOUNTER — Encounter: Payer: BLUE CROSS/BLUE SHIELD | Admitting: Physician Assistant

## 2020-08-04 NOTE — Progress Notes (Signed)
CPE and MEDICARE  Assessment and Plan:  Encounter for Medicare annual wellness exam 1 year  Thrombocytopenia ([place]) -     CBC with Diff - mild, stable, continue to monitor  Medication management -     CBC with Diff -     COMPLETE METABOLIC PANEL WITH GFR -     TSH -     Magnesium  Essential hypertension - continue DASH diet, exercise and monitor at home. Call if greater than 130/80.  -     TSH -     EKG 12-Lead  Vitamin D deficiency -     Vitamin D (25 hydroxy)  Fibrocystic breast disease (FCBD), unspecified laterality Monitor; getting routine mammograms  Osteoporosis Discussed and started alendronate today 07/2020 Plan for follow up DEXA in 09/2021 continue Vit D and Ca, weight bearing exercises -     alendronate (FOSAMAX) 70 MG tablet; Take 1 tablet (70 mg total) by mouth every 7 (seven) days. Take with a full glass of water on an empty stomach.  Other migraine without status migrainosus, not intractable Recently rare  Screening diabetes/other abnormal glucose      -      A1C  Screening cholesterol level -     Lipid Profile  Screening for hematuria or proteinuria -     Urinalysis, Routine w reflex microscopic  Needs flu shot -     Flu vaccine HIGH DOSE PF (Fluzone High dose)  Need for vaccination against Streptococcus pneumoniae -     Pneumococcal conjugate vaccine 23- valent   Bilateral hand pain Following with hand specialist; may benefit from steroid injection  Orders Placed This Encounter  Procedures  . CBC with Differential/Platelet  . COMPLETE METABOLIC PANEL WITH GFR  . Lipid panel  . TSH  . Hemoglobin A1c  . VITAMIN D 25 Hydroxy (Vit-D Deficiency, Fractures)  . Urinalysis, Routine w reflex microscopic  . Magnesium  . Microalbumin / creatinine urine ratio  . EKG 12-Lead    Discussed med's effects and SE's. Screening labs and tests as requested with regular follow-up as recommended. Future Appointments  Date Time Provider [place]   08/07/2021  3:00 PM Liane Comber, NP [facility] None    HPI 69 y.o. female  presents for a complete physical and medicare visit. She has Osteoporosis; Migraines; Hypertension; Thrombocytopenia ([place]); Fibrocystic breast disease; Vitamin D deficiency; and Medication management on their problem list.  Husband with parkinson's- doing fairly. Married 53 years, 2 sons, no grandchildren.  She is still working as Programme researcher, broadcasting/film/video, own business.   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 Follows annually with hand specialist and may discuss steroid injection.    She has hx of migraines, recently rare.   She had DEXA 10/14/2019 showing L fem T -2.8, she is on vitamin D, discussed alendronate today.   BMI is Body mass index is 21.72 kg/m., she has been working on diet and exercise. Wt Readings from Last 3 Encounters:  08/07/20 141 lb 12.8 oz (64.3 kg)  08/05/19 136 lb 9.6 oz (62 kg)  07/31/18 149 lb (67.6 kg)   Her blood pressure has been controlled at home, today their BP is BP: 128/68  She does workout, walks a lot. She denies chest pain, shortness of breath, dizziness.   She is not on cholesterol medication and denies myalgias. Her cholesterol is at goal. The  cholesterol last visit was: Lab Results  Component Value Date   CHOL 177 08/05/2019   HDL 95 08/05/2019   LDLCALC 67 08/05/2019   TRIG 70 08/05/2019   CHOLHDL 1.9 08/05/2019   Last A1C in the office was: Lab Results  Component Value Date   HGBA1C 5.6 06/06/2015    Last GFR:  Lab Results  Component Value Date   GFRNONAA 72 08/05/2019   Patient is on Vitamin D supplement, 4000- 5000 IU daily.  Lab Results  Component Value Date   VD25OH 73 08/05/2019       Current Medications:  Current Outpatient Medications on File Prior to Visit  Medication Sig Dispense Refill  . Ascorbic Acid (VITAMIN C) 1000  MG tablet Take 1,000 mg by mouth daily.     . cholecalciferol (VITAMIN D) 400 UNITS TABS Take 400 Units by mouth daily.      . Glucosamine-Chondroit-Vit C-Mn (GLUCOSAMINE 1500 COMPLEX PO) Take by mouth.    . Multiple Vitamin (MULTIVITAMIN) capsule Take 1 capsule by mouth daily.       No current facility-administered medications on file prior to visit.   Health Maintenance:   Immunization History  Administered Date(s) Administered  . Influenza Inj Mdck Quad With Preservative 07/18/2017, 07/31/2018  . Influenza, High Dose Seasonal PF 08/05/2019  . PFIZER SARS-COV-2 Vaccination 11/07/2019, 12/01/2019  . Pneumococcal Conjugate-13 08/05/2019  . Td 11/07/2004  . Tdap 06/06/2015   Tetanus: 2016 Pneumovax: DUE TODAY Prevnar 13: 07/2019 Flu vaccine: 2020 DUE TODAY  Shingrix: discussed,  Covid 19: 2/2, 2021, pfizer - send info about booster   Pap: 2016 negative HPV, normal test, never abnormal, does not need again 3DMGM: 09/2019 [facility] - schedule next year DEXA: 09/2019 [facility], L fem T -2.8 - starting alendronate 07/2020  Colonoscopy: 07/03/2016 Dr. Collene Mares, due 10 years EGD:  Last eye: Dr. [place], 2021, goes annually  Last dental: last 2021, goes q22m   Medical History:  Past Medical History:  Diagnosis Date  . Essential thrombocytosis ([place])   . Fibrocystic breast disease   . Fibroids    Uterine  . Hypertension   . Migraines   . Osteopenia    Allergies Allergies  Allergen Reactions  . Other     Cigarette smoke    SURGICAL HISTORY She  has a past surgical history that includes Breast surgery. FAMILY HISTORY Her family history includes Atrial fibrillation in her father; Breast cancer in her mother; HIV in her brother. SOCIAL HISTORY She  reports that she has never smoked. She has never used smokeless tobacco. She reports current alcohol use of about 5.0 standard drinks of alcohol per week. She reports that she does not use drugs.  MEDICARE WELLNESS OBJECTIVES: Physical  activity: Current Exercise Habits: Home exercise routine, Type of exercise: walking, Time (Minutes): 20, Frequency (Times/Week): 3, Weekly Exercise (Minutes/Week): 60, Intensity: Mild, Exercise limited by: None identified Cardiac risk factors: Cardiac Risk Factors include: advanced age (>95men, >17 women) Depression/mood screen:   Depression screen [facility] 2/9 08/07/2020  Decreased Interest 0  Down, Depressed, Hopeless 0  PHQ - 2 Score 0    ADLs:  In your present state of health, do you have any difficulty performing the following activities: 08/07/2020  Hearing? N  Vision? N  Difficulty concentrating or making decisions? N  Walking or climbing stairs? N  Dressing or bathing? N  Doing errands, shopping? N  Some recent data might be hidden     Cognitive Testing  Alert? Yes  Normal Appearance?Yes  Oriented to person? Yes  Place? Yes   Time? Yes  Recall of three objects?  Yes  Can perform simple calculations? Yes  Displays appropriate judgment?Yes  Can read the correct time from a watch face?Yes  EOL planning: Does Patient Have a Medical Advance Directive?: Yes Type of Advance Directive: Healthcare Power of Attorney, Living will Does patient want to make changes to medical advance directive?: No - Patient declined Copy of [place] in Chart?: No - copy requested   Review of Systems  Constitutional: Negative for malaise/fatigue and weight loss.  HENT: Negative for hearing loss and tinnitus.   Eyes: Negative for blurred vision and double vision.  Respiratory: Negative for cough, sputum production, shortness of breath and wheezing.   Cardiovascular: Negative for chest pain, palpitations, orthopnea, claudication, leg swelling and PND.  Gastrointestinal: Negative for abdominal pain, blood in stool, constipation, diarrhea, heartburn, melena, nausea and vomiting.  Genitourinary: Negative.   Musculoskeletal: Negative for falls, joint pain (L hand) and myalgias.  Skin:  Negative for rash.  Neurological: Negative for dizziness, tingling, sensory change, weakness and headaches.  Endo/Heme/Allergies: Negative for polydipsia.  Psychiatric/Behavioral: Negative.  Negative for depression, memory loss, substance abuse and suicidal ideas. The patient is not nervous/anxious and does not have insomnia.   All other systems reviewed and are negative.  Physical Exam: Estimated body mass index is 21.72 kg/m as calculated from the following:   Height as of this encounter: 5' 7.75" (1.721 m).   Weight as of this encounter: 141 lb 12.8 oz (64.3 kg). BP 128/68   Pulse 64   Temp (!) 97.3 F (36.3 C)   Ht 5' 7.75" (1.721 m)   Wt 141 lb 12.8 oz (64.3 kg)   SpO2 98%   BMI 21.72 kg/m  General Appearance: Well nourished, in no apparent distress. Eyes: PERRLA, EOMs, conjunctiva no swelling or erythema Sinuses: No Frontal/maxillary tenderness ENT/Mouth: Ext aud canals clear, normal light reflex with TMs without erythema, bulging, right TM crinkled, no erythema, swelling, etc..  Good dentition. No erythema, swelling, or exudate on post pharynx. Tonsils not swollen or erythematous. Hearing normal.  Neck: Supple, thyroid normal. No bruits Respiratory: Respiratory effort normal, BS equal bilaterally without rales, rhonchi, wheezing or stridor. Cardio: RRR without murmurs, rubs or gallops. Brisk peripheral pulses without edema.  Chest: symmetric, with normal excursions and percussion. Breasts: breasts appear normal, no suspicious masses, no skin or nipple changes or axillary nodes. Abdomen: Soft, +BS. Non tender, no guarding, rebound, hernias, masses, or organomegaly.  Lymphatics: Non tender without lymphadenopathy.  Genitourinary: defer Musculoskeletal: Full ROM all peripheral extremities,5/5 strength, and normal gait. Left palm with fibrosis, mild/minimal restriction of 4th  Skin: Warm, dry without rashes, lesions, ecchymosis.  Neuro: Cranial nerves intact, reflexes equal  bilaterally. Normal muscle tone, no cerebellar symptoms. Sensation intact.  Psych: Awake and oriented X 3, normal affect, Insight and Judgment appropriate.   EKG: sinus rhythm, NSCPT   Medicare Attestation I have personally reviewed: The patient's medical and social history Their use of alcohol, tobacco or illicit drugs Their current medications and supplements The patient's functional ability including ADLs,fall risks, home safety risks, cognitive, and hearing and visual impairment Diet and physical activities Evidence for depression or mood disorders  The patient's weight, height, BMI, and visual acuity have been recorded in the chart.  I have made referrals, counseling, and provided education to the patient based on review of the above and I have provided the patient with a written personalized care  plan for preventive services.     Gorden Harms Rindi Beechy 4:09 PM

## 2020-08-07 ENCOUNTER — Ambulatory Visit (INDEPENDENT_AMBULATORY_CARE_PROVIDER_SITE_OTHER): Payer: Medicare Other | Admitting: Adult Health

## 2020-08-07 ENCOUNTER — Other Ambulatory Visit: Payer: Self-pay

## 2020-08-07 ENCOUNTER — Encounter: Payer: Self-pay | Admitting: Adult Health

## 2020-08-07 VITALS — BP 128/68 | HR 64 | Temp 97.3°F | Ht 67.75 in | Wt 141.8 lb

## 2020-08-07 DIAGNOSIS — Z0001 Encounter for general adult medical examination with abnormal findings: Secondary | ICD-10-CM | POA: Diagnosis not present

## 2020-08-07 DIAGNOSIS — N6019 Diffuse cystic mastopathy of unspecified breast: Secondary | ICD-10-CM

## 2020-08-07 DIAGNOSIS — E559 Vitamin D deficiency, unspecified: Secondary | ICD-10-CM

## 2020-08-07 DIAGNOSIS — D219 Benign neoplasm of connective and other soft tissue, unspecified: Secondary | ICD-10-CM

## 2020-08-07 DIAGNOSIS — G43809 Other migraine, not intractable, without status migrainosus: Secondary | ICD-10-CM

## 2020-08-07 DIAGNOSIS — Z79899 Other long term (current) drug therapy: Secondary | ICD-10-CM

## 2020-08-07 DIAGNOSIS — Z7184 Encounter for health counseling related to travel: Secondary | ICD-10-CM

## 2020-08-07 DIAGNOSIS — Z0000 Encounter for general adult medical examination without abnormal findings: Secondary | ICD-10-CM

## 2020-08-07 DIAGNOSIS — I10 Essential (primary) hypertension: Secondary | ICD-10-CM | POA: Diagnosis not present

## 2020-08-07 DIAGNOSIS — Z136 Encounter for screening for cardiovascular disorders: Secondary | ICD-10-CM | POA: Diagnosis not present

## 2020-08-07 DIAGNOSIS — Z23 Encounter for immunization: Secondary | ICD-10-CM | POA: Diagnosis not present

## 2020-08-07 DIAGNOSIS — R7309 Other abnormal glucose: Secondary | ICD-10-CM

## 2020-08-07 DIAGNOSIS — M810 Age-related osteoporosis without current pathological fracture: Secondary | ICD-10-CM | POA: Diagnosis not present

## 2020-08-07 DIAGNOSIS — R6889 Other general symptoms and signs: Secondary | ICD-10-CM | POA: Diagnosis not present

## 2020-08-07 DIAGNOSIS — D696 Thrombocytopenia, unspecified: Secondary | ICD-10-CM | POA: Diagnosis not present

## 2020-08-07 DIAGNOSIS — Z1329 Encounter for screening for other suspected endocrine disorder: Secondary | ICD-10-CM

## 2020-08-07 MED ORDER — SCOPOLAMINE 1 MG/3DAYS TD PT72: 1.0000 | MEDICATED_PATCH | TRANSDERMAL | 0 refills | Status: DC

## 2020-08-07 MED ORDER — ALENDRONATE SODIUM 70 MG PO TABS: 70.0000 mg | ORAL_TABLET | ORAL | 3 refills | Status: DC

## 2020-08-07 NOTE — Patient Instructions (Addendum)
Cynthia Lin , Thank you for taking time to come for your Annual Wellness Visit. I appreciate your ongoing commitment to your health goals. Please review the following plan we discussed and let me know if I can assist you in the future.   These are the goals we discussed: Goals     Exercise 150 min/wk Moderate Activity       This is a list of the screening recommended for you and due dates:  Health Maintenance  Topic Date Due    Hepatitis C: One time screening is recommended by Center for Disease Control  (CDC) for  adults born from [birth year] through 1963.   Never done   Flu Shot  04/16/2020   Pneumonia vaccines (2 of 2 - PPSV23) 08/04/2020   Mammogram  10/12/2020   Tetanus Vaccine  06/05/2025   Colon Cancer Screening  07/03/2026   DEXA scan (bone density measurement)  Completed   COVID-19 Vaccine  Completed    300-600 mg /day calcium -   Can schedule shingrix vaccine - at [facility]    Know what a healthy weight is for you (roughly BMI <25) and aim to maintain this  Aim for 7+ servings of fruits and vegetables daily  65-80+ fluid ounces of water or unsweet tea for healthy kidneys  Limit to max 1 drink of alcohol per day; avoid smoking/tobacco  Limit animal fats in diet for cholesterol and heart health - choose grass fed whenever available  Avoid highly processed foods, and foods high in saturated/trans fats  Aim for low stress - take time to unwind and care for your mental health  Aim for 150 min of moderate intensity exercise weekly for heart health, and weights twice weekly for bone health  Aim for 7-9 hours of sleep daily     Alendronate tablets What is this medicine? ALENDRONATE (a LEN droe nate) slows calcium loss from bones. It helps to make normal healthy bone and to slow bone loss in people with Paget's disease and osteoporosis. It may be used in others at risk for bone loss. This medicine may be used for other purposes; ask your health care provider  or pharmacist if you have questions. COMMON BRAND NAME(S): Fosamax What should I tell my health care provider before I take this medicine? They need to know if you have any of these conditions:  dental disease  esophagus, stomach, or intestine problems, like acid reflux or GERD  kidney disease  low blood calcium  low vitamin D  problems sitting or standing 30 minutes  trouble swallowing  an unusual or allergic reaction to alendronate, other medicines, foods, dyes, or preservatives  pregnant or trying to get pregnant  breast-feeding How should I use this medicine? You must take this medicine exactly as directed or you will lower the amount of the medicine you absorb into your body or you may cause yourself harm. Take this medicine by mouth first thing in the morning, after you are up for the day. Do not eat or drink anything before you take your medicine. Swallow the tablet with a full glass (6 to 8 fluid ounces) of plain water. Do not take this medicine with any other drink. Do not chew or crush the tablet. After taking this medicine, do not eat breakfast, drink, or take any medicines or vitamins for at least 30 minutes. Sit or stand up for at least 30 minutes after you take this medicine; do not lie down. Do not take your medicine more  often than directed. Talk to your pediatrician regarding the use of this medicine in children. Special care may be needed. Overdosage: If you think you have taken too much of this medicine contact a poison control center or emergency room at once. NOTE: This medicine is only for you. Do not share this medicine with others. What if I miss a dose? If you miss a dose, do not take it later in the day. Continue your normal schedule starting the next morning. Do not take double or extra doses. What may interact with this medicine?  aluminum hydroxide  antacids  aspirin  calcium supplements  drugs for inflammation like ibuprofen, naproxen, and  others  iron supplements  magnesium supplements  vitamins with minerals This list may not describe all possible interactions. Give your health care provider a list of all the medicines, herbs, non-prescription drugs, or dietary supplements you use. Also tell them if you smoke, drink alcohol, or use illegal drugs. Some items may interact with your medicine. What should I watch for while using this medicine? Visit your doctor or health care professional for regular checks ups. It may be some time before you see benefit from this medicine. Do not stop taking your medicine except on your doctor's advice. Your doctor or health care professional may order blood tests and other tests to see how you are doing. You should make sure you get enough calcium and vitamin D while you are taking this medicine, unless your doctor tells you not to. Discuss the foods you eat and the vitamins you take with your health care professional. Some people who take this medicine have severe bone, joint, and/or muscle pain. This medicine may also increase your risk for a broken thigh bone. Tell your doctor right away if you have pain in your upper leg or groin. Tell your doctor if you have any pain that does not go away or that gets worse. This medicine can make you more sensitive to the sun. If you get a rash while taking this medicine, sunlight may cause the rash to get worse. Keep out of the sun. If you cannot avoid being in the sun, wear protective clothing and use sunscreen. Do not use sun lamps or tanning beds/booths. What side effects may I notice from receiving this medicine? Side effects that you should report to your doctor or health care professional as soon as possible:  allergic reactions like skin rash, itching or hives, swelling of the face, lips, or tongue  black or tarry stools  bone, muscle or joint pain  changes in vision  chest pain  heartburn or stomach pain  jaw pain, especially after dental  work  pain or trouble when swallowing  redness, blistering, peeling or loosening of the skin, including inside the mouth Side effects that usually do not require medical attention (report to your doctor or health care professional if they continue or are bothersome):  changes in taste  diarrhea or constipation  eye pain or itching  headache  nausea or vomiting  stomach gas or fullness This list may not describe all possible side effects. Call your doctor for medical advice about side effects. You may report side effects to FDA at 1-800-FDA-1088. Where should I keep my medicine? Keep out of the reach of children. Store at room temperature of 15 and 30 degrees C (59 and 86 degrees F). Throw away any unused medicine after the expiration date. NOTE: This sheet is a summary. It may not cover all possible  information. If you have questions about this medicine, talk to your doctor, pharmacist, or health care provider.  2020 Elsevier/Gold Standard (2011-03-01 08:56:09)     Zoster Vaccine, Recombinant injection What is this medicine? ZOSTER VACCINE (ZOS ter vak SEEN) is used to prevent shingles in adults 67 years old and over. This vaccine is not used to treat shingles or nerve pain from shingles. This medicine may be used for other purposes; ask your health care provider or pharmacist if you have questions. COMMON BRAND NAME(S): [facility] What should I tell my health care provider before I take this medicine? They need to know if you have any of these conditions:  blood disorders or disease  cancer like leukemia or lymphoma  immune system problems or therapy  an unusual or allergic reaction to vaccines, other medications, foods, dyes, or preservatives  pregnant or trying to get pregnant  breast-feeding How should I use this medicine? This vaccine is for injection in a muscle. It is given by a health care professional. Talk to your pediatrician regarding the use of this  medicine in children. This medicine is not approved for use in children. Overdosage: If you think you have taken too much of this medicine contact a poison control center or emergency room at once. NOTE: This medicine is only for you. Do not share this medicine with others. What if I miss a dose? Keep appointments for follow-up (booster) doses as directed. It is important not to miss your dose. Call your doctor or health care professional if you are unable to keep an appointment. What may interact with this medicine?  medicines that suppress your immune system  medicines to treat cancer  steroid medicines like prednisone or cortisone This list may not describe all possible interactions. Give your health care provider a list of all the medicines, herbs, non-prescription drugs, or dietary supplements you use. Also tell them if you smoke, drink alcohol, or use illegal drugs. Some items may interact with your medicine. What should I watch for while using this medicine? Visit your doctor for regular check ups. This vaccine, like all vaccines, may not fully protect everyone. What side effects may I notice from receiving this medicine? Side effects that you should report to your doctor or health care professional as soon as possible:  allergic reactions like skin rash, itching or hives, swelling of the face, lips, or tongue  breathing problems Side effects that usually do not require medical attention (report these to your doctor or health care professional if they continue or are bothersome):  chills  headache  fever  nausea, vomiting  redness, warmth, pain, swelling or itching at site where injected  tiredness This list may not describe all possible side effects. Call your doctor for medical advice about side effects. You may report side effects to FDA at 1-800-FDA-1088. Where should I keep my medicine? This vaccine is only given in a clinic, pharmacy, doctor's office, or other health  care setting and will not be stored at home. NOTE: This sheet is a summary. It may not cover all possible information. If you have questions about this medicine, talk to your doctor, pharmacist, or health care provider.  2020 Elsevier/Gold Standard (2017-04-14 13:20:30)

## 2020-08-07 NOTE — Addendum Note (Signed)
Addended by: Chancy Hurter on: 08/07/2020 04:23 PM   Modules accepted: Orders

## 2020-08-08 LAB — COMPLETE METABOLIC PANEL WITH GFR
AG Ratio: 1.8 (calc) (ref 1.0–2.5)
ALT: 20 U/L (ref 6–29)
AST: 21 U/L (ref 10–35)
Albumin: 4.4 g/dL (ref 3.6–5.1)
Alkaline phosphatase (APISO): 69 U/L (ref 37–153)
BUN: 16 mg/dL (ref 7–25)
CO2: 27 mmol/L (ref 20–32)
Calcium: 9.5 mg/dL (ref 8.6–10.4)
Chloride: 105 mmol/L (ref 98–110)
Creat: 0.89 mg/dL (ref 0.50–0.99)
GFR, Est African American: 78 mL/min/{1.73_m2} (ref >=60)
GFR, Est Non African American: 68 mL/min/{1.73_m2} (ref >=60)
Globulin: 2.5 g/dL (calc) (ref 1.9–3.7)
Glucose, Bld: 90 mg/dL (ref 65–99)
Potassium: 3.9 mmol/L (ref 3.5–5.3)
Sodium: 142 mmol/L (ref 135–146)
Total Bilirubin: 0.4 mg/dL (ref 0.2–1.2)
Total Protein: 6.9 g/dL (ref 6.1–8.1)

## 2020-08-08 LAB — TSH: TSH: 1.18 mIU/L (ref 0.40–4.50)

## 2020-08-08 LAB — MICROALBUMIN / CREATININE URINE RATIO
Creatinine, Urine: 65 mg/dL (ref 20–275)
Microalb Creat Ratio: 3 mcg/mg creat (ref <30)
Microalb, Ur: 0.2 mg/dL

## 2020-08-08 LAB — CBC WITH DIFFERENTIAL/PLATELET
Absolute Monocytes: 458 cells/uL (ref 200–950)
Basophils Absolute: 49 cells/uL (ref 0–200)
Basophils Relative: 0.8 %
Eosinophils Absolute: 128 cells/uL (ref 15–500)
Eosinophils Relative: 2.1 %
HCT: 39 % (ref 35.0–45.0)
Hemoglobin: 13.1 g/dL (ref 11.7–15.5)
Lymphs Abs: 1970 cells/uL (ref 850–3900)
MCH: 31 pg (ref 27.0–33.0)
MCHC: 33.6 g/dL (ref 32.0–36.0)
MCV: 92.2 fL (ref 80.0–100.0)
MPV: 12.5 fL (ref 7.5–12.5)
Monocytes Relative: 7.5 %
Neutro Abs: 3495 cells/uL (ref 1500–7800)
Neutrophils Relative %: 57.3 %
Platelets: 133 10*3/uL — ABNORMAL LOW (ref 140–400)
RBC: 4.23 10*6/uL (ref 3.80–5.10)
RDW: 11.8 % (ref 11.0–15.0)
Total Lymphocyte: 32.3 %
WBC: 6.1 10*3/uL (ref 3.8–10.8)

## 2020-08-08 LAB — LIPID PANEL
Cholesterol: 179 mg/dL (ref <200)
HDL: 103 mg/dL (ref >=50)
LDL Cholesterol (Calc): 59 mg/dL (calc)
Non-HDL Cholesterol (Calc): 76 mg/dL (calc) (ref <130)
Total CHOL/HDL Ratio: 1.7 (calc) (ref <5.0)
Triglycerides: 83 mg/dL (ref <150)

## 2020-08-08 LAB — URINALYSIS, ROUTINE W REFLEX MICROSCOPIC
Bilirubin Urine: NEGATIVE
Glucose, UA: NEGATIVE
Hgb urine dipstick: NEGATIVE
Ketones, ur: NEGATIVE
Leukocytes,Ua: NEGATIVE
Nitrite: NEGATIVE
Protein, ur: NEGATIVE
Specific Gravity, Urine: 1.016 (ref 1.001–1.03)
pH: 6.5 (ref 5.0–8.0)

## 2020-08-08 LAB — HEMOGLOBIN A1C
Hgb A1c MFr Bld: 5.5 % of total Hgb (ref <5.7)
Mean Plasma Glucose: 111 (calc)
eAG (mmol/L): 6.2 (calc)

## 2020-08-08 LAB — VITAMIN D 25 HYDROXY (VIT D DEFICIENCY, FRACTURES): Vit D, 25-Hydroxy: 75 ng/mL (ref 30–100)

## 2020-08-08 LAB — MAGNESIUM: Magnesium: 2 mg/dL (ref 1.5–2.5)

## 2020-08-09 ENCOUNTER — Other Ambulatory Visit: Payer: Self-pay

## 2020-08-09 DIAGNOSIS — Z7184 Encounter for health counseling related to travel: Secondary | ICD-10-CM

## 2020-08-09 MED ORDER — ALENDRONATE SODIUM 70 MG PO TABS: 70.0000 mg | ORAL_TABLET | ORAL | 3 refills | Status: DC

## 2020-08-09 MED ORDER — SCOPOLAMINE 1 MG/3DAYS TD PT72: 1.0000 | MEDICATED_PATCH | TRANSDERMAL | 0 refills | Status: DC

## 2020-10-19 LAB — HM MAMMOGRAPHY

## 2020-11-01 ENCOUNTER — Encounter: Payer: Self-pay | Admitting: Internal Medicine

## 2021-07-01 NOTE — Progress Notes (Addendum)
    Future Appointments  Date Time Provider [place]  07/02/2021 10:30 AM Unk Pinto, MD [facility] None  08/07/2021     -  CPE / Wellness  3:00 PM Magda Bernheim, NP [facility] None    History of Present Illness:     Patient is a very nice 70 yo MWF with hx/o labile htn who presents for f/u of recent Breast Infection dx'd when she was out of town and treated with an Antibiotic ( ? Cephalexin).   About 2002, she had a negative Bx of the left lateral breast. In 2013, she had a Right nipple bloody discharge & saw Dr Zella Richer & had a negative ductogram.  Last MGM on 10/19/2020 at [facility] was negative.     Medications     alendronate (FOSAMAX) 70 MG tablet, Take 1 tablet every 7 days.   VITAMIN C  1000 MG tablet, Take daily.    VITAMIN D 400 UNITS TABS, Take daily.     Glucosamine-Chondroit-Vit C-Mn , Take daily   Multiple Vitamin , Take 1 capsule daily.     scopolamine (TRANSDERM-SCOP, 1.5 MG,) 1 MG/3DAYS, Place 1 patch (1.5 mg total) onto the skin every 3 (three) days.  Problem list She has Osteoporosis; Migraines; labile Hypertension; Thrombocytopenia ([place]); Fibrocystic breast disease; Vitamin D deficiency; and Medication management on their problem list.   Observations/Objective:  BP 122/76   Pulse 88   Temp (!) 97.1 F (36.2 C)   Resp 16   Ht 5' 7.75" (1.721 m)   Wt 144 lb 3.2 oz (65.4 kg)   SpO2 96%   BMI 22.09 kg/m   HEENT - WNL. Neck - supple.  Chest - Clear equal BS. Breasts -  Rt breast w/o mass. Left breast has no residual skin changes, but has some query sub-cutaneous thickening of the lateral breast.  Cor - Nl HS. RRR w/o sig MGR. PP 1(+). No edema. MS- FROM w/o deformities.  Gait Nl. Neuro -  Nl w/o focal abnormalities. Skin -   Assessment and Plan:  1. Induration of left breast  - Recommended Breast MRI, but may need to proceed via Diagnostic MGM 1st.    Follow Up Instructions:        I discussed the assessment and treatment plan with  the patient. The patient was provided an opportunity to ask questions and all were answered. The patient agreed with the plan and demonstrated an understanding of the instructions.       The patient was advised to call back or seek an in-person evaluation if the symptoms worsen or if the condition fails to improve as anticipated.    Kirtland Bouchard, MD

## 2021-07-02 ENCOUNTER — Encounter: Payer: Self-pay | Admitting: Internal Medicine

## 2021-07-02 ENCOUNTER — Other Ambulatory Visit: Payer: Self-pay

## 2021-07-02 ENCOUNTER — Ambulatory Visit (INDEPENDENT_AMBULATORY_CARE_PROVIDER_SITE_OTHER): Payer: Medicare Other | Admitting: Internal Medicine

## 2021-07-02 VITALS — BP 122/76 | HR 88 | Temp 97.1°F | Resp 16 | Ht 67.75 in | Wt 144.2 lb

## 2021-07-02 DIAGNOSIS — N6451 Induration of breast: Secondary | ICD-10-CM | POA: Diagnosis not present

## 2021-07-04 NOTE — Addendum Note (Signed)
Addended by: Unk Pinto on: 07/04/2021 09:23 PM   Modules accepted: Orders

## 2021-07-06 ENCOUNTER — Other Ambulatory Visit: Payer: Self-pay | Admitting: Adult Health

## 2021-07-23 ENCOUNTER — Other Ambulatory Visit: Payer: Self-pay

## 2021-07-23 ENCOUNTER — Ambulatory Visit
Admission: RE | Admit: 2021-07-23 | Discharge: 2021-07-23 | Disposition: A | Discharge status: Home / Self Care | Payer: Medicare Other | Source: Ambulatory Visit | Attending: Internal Medicine | Admitting: Internal Medicine

## 2021-07-23 DIAGNOSIS — N6451 Induration of breast: Secondary | ICD-10-CM

## 2021-07-23 MED ORDER — GADOBUTROL 1 MMOL/ML IV SOLN
6.0000 mL | Freq: Once | INTRAVENOUS | Status: AC | PRN
  Administered 2021-07-23: 6 mL via INTRAVENOUS

## 2021-07-23 NOTE — Progress Notes (Signed)
============================================================ ============================================================  -    [place] - No sign of cancer suspected .   - Radiologist recommended resuming routine screening MGMs in Feb 2023 for   annual screening

## 2021-08-06 NOTE — Progress Notes (Signed)
Complete Physical  Assessment and Plan: Essential hypertension - controlled with behavior modifications, DASH diet, exercise and monitor at home. Call if greater than 130/80.  - Urinalysis, Routine w reflex microscopic  - Microalbumin / creatinine urine ratio - CBC - lipid panel - CMP  Dupuytren's contracture left hand Continue to follow with hand specialist  Osteoporosis Osteoporosis- get dexa 2 years, continue fosamax, Vit D and Ca, weight bearing exercises Dexa 10/12/19  Other migraine without status migrainosus, not intractable Remission/controlled since menopause  Uterine leiomyoma, unspecified location Better with menopause  Fibrocystic Breast disease- left breast Area is resolved Advised to limit caffeine intake Mammogram 10/2020 WNL  Thrombocytopenia Continue follow up CBC  Vitamin D deficiency Continue Vit D supplementation Vit D   Medication management Magnesium  Needs flu shot -     FLU VACCINE MDCK QUAD W/Preservative  Continue diet and meds as discussed. Further disposition pending results of labs. Discussed med's effects and SE's.   Over 40 minutes of exam, counseling, chart review, and critical decision making was performed.   HPI 70 y.o. female  presents for a complete physical.has Osteoporosis; Migraines; Hypertension; Thrombocytopenia ([place]); Fibrocystic breast disease; Vitamin D deficiency; and Medication management on their problem list.   Her blood pressure has been controlled at home, today their BP is BP: 126/74   She previously had mastitis which has completely resolved. She has had previous ductogram on this breast which was negative  She is having worsening of Dupuytren's contracture of her left hand. Her index finger will occasionally lock. Unable to play the cello any longer.   Covid infection 2 weeks ago, symptoms have completely resolved.  She does workout, walks a lot. She denies chest pain, shortness of breath, dizziness.  She is  not on cholesterol medication and denies myalgias. Her cholesterol is at goal. The cholesterol last visit was: Lab Results  Component Value Date   CHOL 179 08/07/2020   HDL 103 08/07/2020   LDLCALC 59 08/07/2020   TRIG 83 08/07/2020   CHOLHDL 1.7 08/07/2020   Last A1C in the office was: Lab Results  Component Value Date   HGBA1C 5.5 08/07/2020   Patient is on Vitamin D supplement, 5000 IU daily.  Lab Results  Component Value Date   VD25OH 75 08/07/2020   BMI is Body mass index is 21.93 kg/m., she is working on diet and exercise. Wt Readings from Last 3 Encounters:  08/07/21 144 lb 3.2 oz (65.4 kg)  07/02/21 144 lb 3.2 oz (65.4 kg)  08/07/20 141 lb 12.8 oz (64.3 kg)    Current Medications:  Current Outpatient Medications on File Prior to Visit  Medication Sig Dispense Refill   alendronate (FOSAMAX) 70 MG tablet TAKE 1 TABLET(70 MG) BY MOUTH EVERY 7 DAYS WITH A FULL GLASS OF WATER AND ON AN EMPTY STOMACH 12 tablet 3   Ascorbic Acid (VITAMIN C) 1000 MG tablet Take 1,000 mg by mouth daily.      cholecalciferol (VITAMIN D) 400 UNITS TABS Take 400 Units by mouth daily.       Glucosamine-Chondroit-Vit C-Mn (GLUCOSAMINE 1500 COMPLEX PO) Take by mouth.     Multiple Vitamin (MULTIVITAMIN) capsule Take 1 capsule by mouth daily.       scopolamine (TRANSDERM-SCOP, 1.5 MG,) 1 MG/3DAYS Place 1 patch (1.5 mg total) onto the skin every 3 (three) days. 4 patch 0   No current facility-administered medications on file prior to visit.   Health Maintenance:   Immunization History  Administered Date(s) Administered  Influenza Inj Mdck Quad With Preservative 07/18/2017, 07/31/2018   Influenza, High Dose Seasonal PF 08/05/2019, 08/07/2020   PFIZER Comirnaty(Gray Top)Covid-19 Tri-Sucrose Vaccine 12/18/2020   PFIZER(Purple Top)SARS-COV-2 Vaccination 11/07/2019, 12/01/2019   Pfizer Covid-19 Vaccine Bivalent Booster 45yrs & up 06/06/2021   Pneumococcal Conjugate-13 08/05/2019   Pneumococcal  Polysaccharide-23 08/07/2020   Td 11/07/2004   Tdap 06/06/2015   Tetanus: 2016 Pneumovax: N/A Prevnar 23: 12/01/19 Flu vaccine: will get later Zostavax: N/A  Pap: 2016 negative HPV, normal test, never abnormal, does not need again 3DMGM: 10/2020 benign repeat 1 year DEXA: 10/12/19 osteoporosis Colonoscopy: 07/03/2016 Dr. Collene Mares, next due 2027 EGD:  Medical History:  Past Medical History:  Diagnosis Date   Essential thrombocytosis ([place])    Fibrocystic breast disease    Fibroids    Uterine   Hypertension    Migraines    Osteopenia    Allergies Allergies  Allergen Reactions   Other     Cigarette smoke    SURGICAL HISTORY She  has a past surgical history that includes Breast surgery. FAMILY HISTORY Her family history includes Atrial fibrillation in her father; Breast cancer in her mother; HIV in her brother. SOCIAL HISTORY She  reports that she has never smoked. She has never used smokeless tobacco. She reports current alcohol use of about 5.0 standard drinks per week. She reports that she does not use drugs.  Review of Systems  Constitutional: Negative.  Negative for chills and fever.  HENT: Negative.  Negative for congestion, hearing loss, sinus pain, sore throat and tinnitus.   Eyes: Negative.  Negative for blurred vision and double vision.  Respiratory: Negative.  Negative for cough, hemoptysis, sputum production, shortness of breath and wheezing.   Cardiovascular: Negative.  Negative for chest pain, palpitations and leg swelling.  Gastrointestinal: Negative.  Negative for abdominal pain, constipation, diarrhea, heartburn, nausea and vomiting.  Genitourinary: Negative.  Negative for dysuria and urgency.  Musculoskeletal: Negative.  Negative for back pain, falls, joint pain, myalgias and neck pain.  Skin: Negative.  Negative for rash.  Neurological: Negative.  Negative for dizziness, tingling, tremors, weakness and headaches.  Endo/Heme/Allergies: Negative.  Does not  bruise/bleed easily.  Psychiatric/Behavioral: Negative.  Negative for depression and suicidal ideas. The patient is not nervous/anxious and does not have insomnia.   Physical Exam: Estimated body mass index is 21.93 kg/m as calculated from the following:   Height as of this encounter: 5\' 8"  (1.727 m).   Weight as of this encounter: 144 lb 3.2 oz (65.4 kg). BP 126/74   Pulse 71   Temp 97.7 F (36.5 C)   Ht 5\' 8"  (1.727 m)   Wt 144 lb 3.2 oz (65.4 kg)   SpO2 97%   BMI 21.93 kg/m  General Appearance: Well nourished, in no apparent distress. Eyes: PERRLA, EOMs, conjunctiva no swelling or erythema, normal fundi and vessels. Sinuses: No Frontal/maxillary tenderness ENT/Mouth: Ext aud canals clear, normal light reflex with TMs without erythema, bulging, right TM crinkled, no erythema, swelling, etc..  Good dentition. No erythema, swelling, or exudate on post pharynx. Tonsils not swollen or erythematous. Hearing normal.  Neck: Supple, thyroid normal. No bruits Respiratory: Respiratory effort normal, BS equal bilaterally without rales, rhonchi, wheezing or stridor. Cardio: RRR without murmurs, rubs or gallops. Brisk peripheral pulses without edema.  Chest: symmetric, with normal excursions and percussion. Breasts: breasts appear normal, no suspicious masses, no skin or nipple changes or axillary nodes.  Abdomen: Soft, +BS. Non tender, no guarding, rebound, hernias, masses, or organomegaly. Marland Kitchen  Lymphatics: Non tender without lymphadenopathy.  Genitourinary: defer Musculoskeletal: Full ROM all peripheral extremities,5/5 strength, and normal gait. Left palm with fibrosis index finger is being pulled inward  Skin: Warm, dry without rashes, lesions, ecchymosis.  Neuro: Cranial nerves intact, reflexes equal bilaterally. Normal muscle tone, no cerebellar symptoms. Sensation intact.  Psych: Awake and oriented X 3, normal affect, Insight and Judgment appropriate.   EKG: Sinus bradycardia, no ST  changes  Avaiyah Strubel W Keirstin Musil 3:16 PM

## 2021-08-07 ENCOUNTER — Other Ambulatory Visit: Payer: Self-pay

## 2021-08-07 ENCOUNTER — Ambulatory Visit (INDEPENDENT_AMBULATORY_CARE_PROVIDER_SITE_OTHER): Payer: Medicare Other | Admitting: Nurse Practitioner

## 2021-08-07 ENCOUNTER — Encounter: Payer: Self-pay | Admitting: Nurse Practitioner

## 2021-08-07 VITALS — BP 126/74 | HR 71 | Temp 97.7°F | Ht 68.0 in | Wt 144.2 lb

## 2021-08-07 DIAGNOSIS — E559 Vitamin D deficiency, unspecified: Secondary | ICD-10-CM | POA: Diagnosis not present

## 2021-08-07 DIAGNOSIS — Z23 Encounter for immunization: Secondary | ICD-10-CM | POA: Diagnosis not present

## 2021-08-07 DIAGNOSIS — D696 Thrombocytopenia, unspecified: Secondary | ICD-10-CM | POA: Diagnosis not present

## 2021-08-07 DIAGNOSIS — I10 Essential (primary) hypertension: Secondary | ICD-10-CM | POA: Diagnosis not present

## 2021-08-07 DIAGNOSIS — G43809 Other migraine, not intractable, without status migrainosus: Secondary | ICD-10-CM

## 2021-08-07 DIAGNOSIS — N6019 Diffuse cystic mastopathy of unspecified breast: Secondary | ICD-10-CM

## 2021-08-07 DIAGNOSIS — Z136 Encounter for screening for cardiovascular disorders: Secondary | ICD-10-CM | POA: Diagnosis not present

## 2021-08-07 DIAGNOSIS — R7309 Other abnormal glucose: Secondary | ICD-10-CM

## 2021-08-07 DIAGNOSIS — M720 Palmar fascial fibromatosis [Dupuytren]: Secondary | ICD-10-CM

## 2021-08-07 DIAGNOSIS — Z79899 Other long term (current) drug therapy: Secondary | ICD-10-CM

## 2021-08-07 DIAGNOSIS — M810 Age-related osteoporosis without current pathological fracture: Secondary | ICD-10-CM

## 2021-08-07 NOTE — Patient Instructions (Signed)

## 2021-08-08 LAB — URINALYSIS, ROUTINE W REFLEX MICROSCOPIC
Bilirubin Urine: NEGATIVE
Glucose, UA: NEGATIVE
Hgb urine dipstick: NEGATIVE
Ketones, ur: NEGATIVE
Leukocytes,Ua: NEGATIVE
Nitrite: NEGATIVE
Protein, ur: NEGATIVE
Specific Gravity, Urine: 1.015 (ref 1.001–1.035)
pH: <=5 (ref 5.0–8.0)

## 2021-08-08 LAB — COMPLETE METABOLIC PANEL WITH GFR
AG Ratio: 1.7 (calc) (ref 1.0–2.5)
ALT: 13 U/L (ref 6–29)
AST: 18 U/L (ref 10–35)
Albumin: 4.3 g/dL (ref 3.6–5.1)
Alkaline phosphatase (APISO): 50 U/L (ref 37–153)
BUN/Creatinine Ratio: 17 (calc) (ref 6–22)
BUN: 18 mg/dL (ref 7–25)
CO2: 28 mmol/L (ref 20–32)
Calcium: 9.7 mg/dL (ref 8.6–10.4)
Chloride: 103 mmol/L (ref 98–110)
Creat: 1.06 mg/dL — ABNORMAL HIGH (ref 0.50–1.05)
Globulin: 2.6 g/dL (calc) (ref 1.9–3.7)
Glucose, Bld: 94 mg/dL (ref 65–99)
Potassium: 4.3 mmol/L (ref 3.5–5.3)
Sodium: 141 mmol/L (ref 135–146)
Total Bilirubin: 0.4 mg/dL (ref 0.2–1.2)
Total Protein: 6.9 g/dL (ref 6.1–8.1)
eGFR: 58 mL/min/{1.73_m2} — ABNORMAL LOW (ref >=60)

## 2021-08-08 LAB — CBC WITH DIFFERENTIAL/PLATELET
Absolute Monocytes: 507 cells/uL (ref 200–950)
Basophils Absolute: 59 cells/uL (ref 0–200)
Basophils Relative: 1 %
Eosinophils Absolute: 171 cells/uL (ref 15–500)
Eosinophils Relative: 2.9 %
HCT: 40.1 % (ref 35.0–45.0)
Hemoglobin: 13.7 g/dL (ref 11.7–15.5)
Lymphs Abs: 1794 cells/uL (ref 850–3900)
MCH: 31.6 pg (ref 27.0–33.0)
MCHC: 34.2 g/dL (ref 32.0–36.0)
MCV: 92.6 fL (ref 80.0–100.0)
MPV: 12.6 fL — ABNORMAL HIGH (ref 7.5–12.5)
Monocytes Relative: 8.6 %
Neutro Abs: 3369 cells/uL (ref 1500–7800)
Neutrophils Relative %: 57.1 %
Platelets: 169 10*3/uL (ref 140–400)
RBC: 4.33 10*6/uL (ref 3.80–5.10)
RDW: 11.9 % (ref 11.0–15.0)
Total Lymphocyte: 30.4 %
WBC: 5.9 10*3/uL (ref 3.8–10.8)

## 2021-08-08 LAB — LIPID PANEL
Cholesterol: 178 mg/dL (ref <200)
HDL: 102 mg/dL (ref >=50)
LDL Cholesterol (Calc): 57 mg/dL (calc)
Non-HDL Cholesterol (Calc): 76 mg/dL (calc) (ref <130)
Total CHOL/HDL Ratio: 1.7 (calc) (ref <5.0)
Triglycerides: 100 mg/dL (ref <150)

## 2021-08-08 LAB — MAGNESIUM: Magnesium: 2.1 mg/dL (ref 1.5–2.5)

## 2021-08-08 LAB — MICROALBUMIN / CREATININE URINE RATIO
Creatinine, Urine: 62 mg/dL (ref 20–275)
Microalb, Ur: <0.2 mg/dL

## 2021-08-08 LAB — HEMOGLOBIN A1C
Hgb A1c MFr Bld: 5.4 % of total Hgb (ref <5.7)
Mean Plasma Glucose: 108 mg/dL
eAG (mmol/L): 6 mmol/L

## 2021-08-08 LAB — VITAMIN D 25 HYDROXY (VIT D DEFICIENCY, FRACTURES): Vit D, 25-Hydroxy: 80 ng/mL (ref 30–100)

## 2021-10-25 LAB — HM MAMMOGRAPHY

## 2021-10-31 ENCOUNTER — Encounter: Payer: Self-pay | Admitting: Internal Medicine

## 2022-02-05 NOTE — Progress Notes (Unsigned)
6 month follow up and MEDICARE Annual Wellness  Assessment and Plan:  Encounter for Medicare annual wellness exam 1 year  Thrombocytopenia ([place]) -     CBC with Diff - mild, stable, continue to monitor  Medication management -     CBC with Diff -     COMPLETE METABOLIC PANEL WITH GFR -     TSH -     Magnesium  Essential hypertension - continue DASH diet, exercise and monitor at home. Call if greater than 130/80.  -     TSH -     EKG 12-Lead  Vitamin D deficiency -     Vitamin D (25 hydroxy)  Fibrocystic breast disease ([facility]), unspecified laterality Monitor; getting routine mammograms  Osteoporosis Discussed and started alendronate today 07/2020 Plan for follow up DEXA in 09/2021 continue Vit D and Ca, weight bearing exercises -     alendronate (FOSAMAX) 70 MG tablet; Take 1 tablet (70 mg total) by mouth every 7 (seven) days. Take with a full glass of water on an empty stomach.  Other migraine without status migrainosus, not intractable Recently rare    Bilateral hand pain Following with hand specialist; may benefit from steroid injection  No orders of the defined types were placed in this encounter.   Discussed med's effects and SE's. Screening labs and tests as requested with regular follow-up as recommended. Future Appointments  Date Time Provider [place]  02/07/2022  9:30 AM Magda Bernheim, NP [facility] None  08/07/2022 10:00 AM Magda Bernheim, NP [facility] None    HPI 71 y.o. female  presents for a complete physical and medicare visit. She has Osteoporosis; Migraines; Hypertension; Thrombocytopenia ([place]); Fibrocystic breast disease; Vitamin D deficiency; and Medication management on their problem list.  Husband with parkinson's- doing fairly. Married 72 years, 2 sons, no grandchildren.  She is still working as Programme researcher, broadcasting/film/video, own business.   Has mild contracture to left palm/4th digit, no injury, has pain and has noted mild weakness this year while  playing cello, had normal sed rate and RF in 2020. Follows annually with hand specialist and may discuss steroid injection.    She has hx of migraines, recently rare.   She had DEXA 10/14/2019 showing L fem T -2.8, she is on vitamin D, discussed alendronate today.   BMI is There is no height or weight on file to calculate BMI., she has been working on diet and exercise. Wt Readings from Last 3 Encounters:  08/07/21 144 lb 3.2 oz (65.4 kg)  07/02/21 144 lb 3.2 oz (65.4 kg)  08/07/20 141 lb 12.8 oz (64.3 kg)   Her blood pressure has been controlled at home, today their BP is    She does workout, walks a lot. She denies chest pain, shortness of breath, dizziness.   She is not on cholesterol medication and denies myalgias. Her cholesterol is at goal. The cholesterol last visit was: Lab Results  Component Value Date   CHOL 178 08/07/2021   HDL 102 08/07/2021   LDLCALC 57 08/07/2021   TRIG 100 08/07/2021   CHOLHDL 1.7 08/07/2021   Last A1C in the office was: Lab Results  Component Value Date   HGBA1C 5.4 08/07/2021    Last GFR:  Lab Results  Component Value Date   GFRNONAA 68 08/07/2020   Patient is on Vitamin D supplement, 4000- 5000 IU daily.  Lab Results  Component Value Date   VD25OH 80 08/07/2021       Current Medications:  Current Outpatient Medications on File Prior to Visit  Medication Sig Dispense Refill   alendronate (FOSAMAX) 70 MG tablet TAKE 1 TABLET(70 MG) BY MOUTH EVERY 7 DAYS WITH A FULL GLASS OF WATER AND ON AN EMPTY STOMACH 12 tablet 3   Ascorbic Acid (VITAMIN C) 1000 MG tablet Take 1,000 mg by mouth daily.      Cholecalciferol (VITAMIN D) 125 MCG (5000 UT) CAPS Take by mouth.     Glucosamine-Chondroit-Vit C-Mn (GLUCOSAMINE 1500 COMPLEX PO) Take by mouth. (Patient not taking: Reported on 08/07/2021)     Multiple Vitamin (MULTIVITAMIN) capsule Take 1 capsule by mouth daily.       scopolamine (TRANSDERM-SCOP, 1.5 MG,) 1 MG/3DAYS Place 1 patch (1.5 mg total)  onto the skin every 3 (three) days. 4 patch 0   No current facility-administered medications on file prior to visit.   Health Maintenance:   Immunization History  Administered Date(s) Administered   Influenza Inj Mdck Quad With Preservative 07/18/2017, 07/31/2018   Influenza, High Dose Seasonal PF 08/05/2019, 08/07/2020, 08/07/2021   PFIZER Comirnaty(Gray Top)Covid-19 Tri-Sucrose Vaccine 12/18/2020   PFIZER(Purple Top)SARS-COV-2 Vaccination 11/07/2019, 12/01/2019   Pfizer Covid-19 Vaccine Bivalent Booster 50yr & up 06/06/2021   Pneumococcal Conjugate-13 08/05/2019   Pneumococcal Polysaccharide-23 08/07/2020   Td 11/07/2004   Tdap 06/06/2015   Tetanus: 2016 Pneumovax: DUE TODAY Prevnar 13: 07/2019 Flu vaccine: 2020 DUE TODAY  Shingrix: discussed,  Covid 19: 2/2, 2021, pfizer - send info about booster   Pap: 2016 negative HPV, normal test, never abnormal, does not need again 3DMGM: 09/2019 [facility] - schedule next year DEXA: 09/2019 [facility], L fem T -2.8 - starting alendronate 07/2020  Colonoscopy: 07/03/2016 Dr. MCollene Mares due 10 years EGD:  Last eye: Dr. [place] 2021, goes annually  Last dental: last 2021, goes q675m Medical History:  Past Medical History:  Diagnosis Date   [place]ential [place]   Fibrocystic breast disease    Fibroids    Uterine   Hypertension    Migraines    Osteopenia    Allergies Allergies  Allergen Reactions   Other     Cigarette smoke    SURGICAL HISTORY She  has a past surgical history that includes Breast surgery. FAMILY HISTORY Her family history includes Atrial fibrillation in her father; Breast cancer in her mother; HIV in her brother. SOCIAL HISTORY She  reports that she has never smoked. She has never used smokeless tobacco. She reports current alcohol use of about 5.0 standard drinks per week. She reports that she does not use drugs.  MEDICARE WELLNESS OBJECTIVES: Physical activity:   Cardiac risk factors:   Depression/mood  screen:      08/07/2020    3:20 PM  Depression screen PHQ 2/9  Decreased Interest 0  Down, Depressed, Hopeless 0  PHQ - 2 Score 0    ADLs:      View : No data to display.            Cognitive Testing  Alert? Yes  Normal Appearance?Yes  Oriented to person? Yes  Place? Yes   Time? Yes  Recall of three objects?  Yes  Can perform simple calculations? Yes  Displays appropriate judgment?Yes  Can read the correct time from a watch face?Yes  EOL planning:     Review of Systems  Constitutional:  Negative for malaise/fatigue and weight loss.  HENT:  Negative for hearing loss and tinnitus.   Eyes:  Negative for blurred vision and double vision.  Respiratory:  Negative for cough, sputum production, shortness of breath and wheezing.   Cardiovascular:  Negative for chest pain, palpitations, orthopnea, claudication, leg swelling and PND.  Gastrointestinal:  Negative for abdominal pain, blood in stool, constipation, diarrhea, heartburn, melena, nausea and vomiting.  Genitourinary: Negative.   Musculoskeletal:  Negative for falls, joint pain (L hand) and myalgias.  Skin:  Negative for rash.  Neurological:  Negative for dizziness, tingling, sensory change, weakness and headaches.  Endo/Heme/Allergies:  Negative for polydipsia.  Psychiatric/Behavioral: Negative.  Negative for depression, memory loss, substance abuse and suicidal ideas. The patient is not nervous/anxious and does not have insomnia.   All other systems reviewed and are negative. Physical Exam: Estimated body mass index is 21.93 kg/m as calculated from the following:   Height as of 08/07/21: '5\' 8"'$  (1.727 m).   Weight as of 08/07/21: 144 lb 3.2 oz (65.4 kg). There were no vitals taken for this visit. General Appearance: Well nourished, in no apparent distress. Eyes: PERRLA, EOMs, conjunctiva no swelling or erythema Sinuses: No Frontal/maxillary tenderness ENT/Mouth: Ext aud canals clear, normal light reflex with TMs  without erythema, bulging, right TM crinkled, no erythema, swelling, etc..  Good dentition. No erythema, swelling, or exudate on post pharynx. Tonsils not swollen or erythematous. Hearing normal.  Neck: Supple, thyroid normal. No bruits Respiratory: Respiratory effort normal, BS equal bilaterally without rales, rhonchi, wheezing or stridor. Cardio: RRR without murmurs, rubs or gallops. Brisk peripheral pulses without edema.  Chest: symmetric, with normal excursions and percussion. Breasts: breasts appear normal, no suspicious masses, no skin or nipple changes or axillary nodes. Abdomen: Soft, +BS. Non tender, no guarding, rebound, hernias, masses, or organomegaly.  Lymphatics: Non tender without lymphadenopathy.  Genitourinary: defer Musculoskeletal: Full ROM all peripheral extremities,5/5 strength, and normal gait. Left palm with fibrosis, mild/minimal restriction of 4th  Skin: Warm, dry without rashes, lesions, ecchymosis.  Neuro: Cranial nerves intact, reflexes equal bilaterally. Normal muscle tone, no cerebellar symptoms. Sensation intact.  Psych: Awake and oriented X 3, normal affect, Insight and Judgment appropriate.   EKG: sinus rhythm, NSCPT   Medicare Attestation I have personally reviewed: The patient's medical and social history Their use of alcohol, tobacco or illicit drugs Their current medications and supplements The patient's functional ability including ADLs,fall risks, home safety risks, cognitive, and hearing and visual impairment Diet and physical activities Evidence for depression or mood disorders  The patient's weight, height, BMI, and visual acuity have been recorded in the chart.  I have made referrals, counseling, and provided education to the patient based on review of the above and I have provided the patient with a written personalized care plan for preventive services.     Sharone Picchi W Geary Rufo 1:37 PM

## 2022-02-07 ENCOUNTER — Encounter: Payer: Self-pay | Admitting: Nurse Practitioner

## 2022-02-07 ENCOUNTER — Ambulatory Visit (INDEPENDENT_AMBULATORY_CARE_PROVIDER_SITE_OTHER): Payer: Medicare Other | Admitting: Nurse Practitioner

## 2022-02-07 VITALS — BP 118/70 | HR 64 | Temp 97.7°F | Wt 142.2 lb

## 2022-02-07 DIAGNOSIS — M810 Age-related osteoporosis without current pathological fracture: Secondary | ICD-10-CM

## 2022-02-07 DIAGNOSIS — G43809 Other migraine, not intractable, without status migrainosus: Secondary | ICD-10-CM

## 2022-02-07 DIAGNOSIS — R6889 Other general symptoms and signs: Secondary | ICD-10-CM

## 2022-02-07 DIAGNOSIS — Z0000 Encounter for general adult medical examination without abnormal findings: Secondary | ICD-10-CM

## 2022-02-07 DIAGNOSIS — I10 Essential (primary) hypertension: Secondary | ICD-10-CM | POA: Diagnosis not present

## 2022-02-07 DIAGNOSIS — Z0001 Encounter for general adult medical examination with abnormal findings: Secondary | ICD-10-CM | POA: Diagnosis not present

## 2022-02-07 DIAGNOSIS — R7309 Other abnormal glucose: Secondary | ICD-10-CM

## 2022-02-07 DIAGNOSIS — M720 Palmar fascial fibromatosis [Dupuytren]: Secondary | ICD-10-CM

## 2022-02-07 DIAGNOSIS — N1831 Chronic kidney disease, stage 3a: Secondary | ICD-10-CM | POA: Diagnosis not present

## 2022-02-07 DIAGNOSIS — E559 Vitamin D deficiency, unspecified: Secondary | ICD-10-CM

## 2022-02-07 DIAGNOSIS — D696 Thrombocytopenia, unspecified: Secondary | ICD-10-CM

## 2022-02-07 DIAGNOSIS — N6019 Diffuse cystic mastopathy of unspecified breast: Secondary | ICD-10-CM

## 2022-02-07 DIAGNOSIS — Z79899 Other long term (current) drug therapy: Secondary | ICD-10-CM | POA: Diagnosis not present

## 2022-02-08 LAB — CBC WITH DIFFERENTIAL/PLATELET
Absolute Monocytes: 442 cells/uL (ref 200–950)
Basophils Absolute: 42 cells/uL (ref 0–200)
Basophils Relative: 0.9 %
Eosinophils Absolute: 122 cells/uL (ref 15–500)
Eosinophils Relative: 2.6 %
HCT: 40.7 % (ref 35.0–45.0)
Hemoglobin: 13.2 g/dL (ref 11.7–15.5)
Lymphs Abs: 1330 cells/uL (ref 850–3900)
MCH: 30.6 pg (ref 27.0–33.0)
MCHC: 32.4 g/dL (ref 32.0–36.0)
MCV: 94.4 fL (ref 80.0–100.0)
MPV: 12.5 fL (ref 7.5–12.5)
Monocytes Relative: 9.4 %
Neutro Abs: 2764 cells/uL (ref 1500–7800)
Neutrophils Relative %: 58.8 %
Platelets: 124 10*3/uL — ABNORMAL LOW (ref 140–400)
RBC: 4.31 10*6/uL (ref 3.80–5.10)
RDW: 12.2 % (ref 11.0–15.0)
Total Lymphocyte: 28.3 %
WBC: 4.7 10*3/uL (ref 3.8–10.8)

## 2022-02-08 LAB — COMPLETE METABOLIC PANEL WITH GFR
AG Ratio: 1.7 (calc) (ref 1.0–2.5)
ALT: 17 U/L (ref 6–29)
AST: 18 U/L (ref 10–35)
Albumin: 4.3 g/dL (ref 3.6–5.1)
Alkaline phosphatase (APISO): 42 U/L (ref 37–153)
BUN: 21 mg/dL (ref 7–25)
CO2: 25 mmol/L (ref 20–32)
Calcium: 9.8 mg/dL (ref 8.6–10.4)
Chloride: 105 mmol/L (ref 98–110)
Creat: 1.02 mg/dL (ref 0.50–1.05)
Globulin: 2.5 g/dL (calc) (ref 1.9–3.7)
Glucose, Bld: 92 mg/dL (ref 65–99)
Potassium: 4.7 mmol/L (ref 3.5–5.3)
Sodium: 141 mmol/L (ref 135–146)
Total Bilirubin: 0.6 mg/dL (ref 0.2–1.2)
Total Protein: 6.8 g/dL (ref 6.1–8.1)
eGFR: 60 mL/min/{1.73_m2} (ref >=60)

## 2022-02-08 LAB — TSH: TSH: 1.02 mIU/L (ref 0.40–4.50)

## 2022-02-08 LAB — HEMOGLOBIN A1C
Hgb A1c MFr Bld: 5.2 % of total Hgb (ref <5.7)
Mean Plasma Glucose: 103 mg/dL
eAG (mmol/L): 5.7 mmol/L

## 2022-06-05 ENCOUNTER — Other Ambulatory Visit: Payer: Self-pay | Admitting: Nurse Practitioner

## 2022-06-05 ENCOUNTER — Other Ambulatory Visit: Payer: Self-pay

## 2022-06-05 DIAGNOSIS — M810 Age-related osteoporosis without current pathological fracture: Secondary | ICD-10-CM

## 2022-06-05 MED ORDER — ALENDRONATE SODIUM 70 MG PO TABS: 70.0000 mg | ORAL_TABLET | ORAL | 11 refills | Status: DC

## 2022-08-06 NOTE — Progress Notes (Incomplete)
Annual Screening/Preventative Visit &  Comprehensive Evaluation &  Examination   Future Appointments  Date Time Provider Department  08/07/2022 11:00 AM Unk Pinto, MD [facility]  08/11/2023 11:00 AM Unk Pinto, MD [facility]        This very nice 71 y.o. MWF presents for a Screening /Preventative Visit & comprehensive evaluation and management of multiple medical co-morbidities.  Patient has been followed for HTN, HLD, T2_NIDDM  Prediabetes  and Vitamin D Deficiency.        HTN predates since     . Patient's BP has been controlled at home and patient denies any cardiac symptoms as chest pain, palpitations, shortness of breath, dizziness or ankle swelling. Today's          Patient's hyperlipidemia is controlled with diet and medications. Patient denies myalgias or other medication SE's. Last lipids were at goal :  Lab Results  Component Value Date   CHOL 178 08/07/2021   HDL 102 08/07/2021   LDLCALC 57 08/07/2021   TRIG 100 08/07/2021   CHOLHDL 1.7 08/07/2021         Patient has hx/o T2_NIDDM prediabetes predating since      and patient denies reactive hypoglycemic symptoms, visual blurring, diabetic polys or paresthesias. Last A1c was normal & at goal :  Lab Results  Component Value Date   HGBA1C 5.2 02/07/2022         Finally, patient has history of Vitamin D Deficiency and last Vitamin D was  at goal :  Lab Results  Component Value Date   VD25OH 29 08/07/2021     Current Outpatient Medications on File Prior to Visit  Medication Sig  . alendronate (FOSAMAX) 70 MG tablet Take 1 tablet (70 mg total) by mouth every 7 (seven) days. Take with a full glass of water on an empty stomach.  . Ascorbic Acid (VITAMIN C) 1000 MG tablet Take 1,000 mg by mouth daily.   . Cholecalciferol (VITAMIN D) 125 MCG (5000 UT) CAPS Take by mouth.  . Glucosamine-Chondroit-Vit C-Mn (GLUCOSAMINE 1500 COMPLEX PO) Take by mouth. (Patient not taking: Reported on 08/07/2021)  .  Multiple Vitamin (MULTIVITAMIN) capsule Take 1 capsule by mouth daily.    Marland Kitchen scopolamine (TRANSDERM-SCOP, 1.5 MG,) 1 MG/3DAYS Place 1 patch (1.5 mg total) onto the skin every 3 (three) days.      Allergies  Allergen Reactions  . Other     Cigarette smoke     Past Medical History:  Diagnosis Date  . Essential thrombocytosis ([place])   . Fibrocystic breast disease   . Fibroids    Uterine  . Hypertension   . Migraines   . Osteopenia      Health Maintenance  Topic Date Due  . Medicare Annual Wellness (AWV)  Never done  . Zoster Vaccines- Shingrix (1 of 2) Never done  . INFLUENZA VACCINE  04/16/2022  . COVID-19 Vaccine (5 - 2023-24 season) 05/17/2022  . Hepatitis C Screening  02/08/2023 (Originally 08/18/1969)  . MAMMOGRAM  10/25/2022  . COLONOSCOPY (Pts 45-85yr Insurance coverage will need to be confirmed)  07/03/2026  . Pneumonia Vaccine 71 Years old  Completed  . DEXA SCAN  Completed  . HPV VACCINES  Aged Out     Immunization History  Administered Date(s) Administered  . Influenza Inj Mdck Quad 07/18/2017, 07/31/2018  . Influenza, High Dose  08/05/2019, 08/07/2020, 08/07/2021  . PFIZER Covid-19 Tri-Sucrose Vacc 12/18/2020  . PFIZER\-SARS-COV-2 Vacc 11/07/2019, 12/01/2019  . PWater engineer 06/06/2021  .  Pneumococcal  - 13 08/05/2019  . Pneumococcal  - 23 08/07/2020  . Td 11/07/2004  . Tdap 06/06/2015     Last Colon - 07/03/2016 - [facility] - Recc 10 yr f/u - due Oct 2027  Last MGM - 10/25/2021   Last DexaBMD - 10/12/2019 Osteoporosis (T-2.8)    Past Surgical History:  Procedure Laterality Date  . BREAST SURGERY     Left breast biopsy (benign)     Family History  Problem Relation Age of Onset  . Breast cancer Mother        Breast  . Atrial fibrillation Father   . HIV Brother      Social History   Tobacco Use  . Smoking status: Never  . Smokeless tobacco: Never  Vaping Use  . Vaping Use: Never used  Substance Use Topics  .  Alcohol use: Yes    Alcohol/week: 5.0 standard drinks of alcohol    Types: 5 Glasses of wine per week  . Drug use: No      ROS Constitutional: Denies fever, chills, weight loss/gain, headaches, insomnia,  night sweats, and change in appetite. Does c/o fatigue. Eyes: Denies redness, blurred vision, diplopia, discharge, itchy, watery eyes.  ENT: Denies discharge, congestion, post nasal drip, epistaxis, sore throat, earache, hearing loss, dental pain, Tinnitus, Vertigo, Sinus pain, snoring.  Cardio: Denies chest pain, palpitations, irregular heartbeat, syncope, dyspnea, diaphoresis, orthopnea, PND, claudication, edema Respiratory: denies cough, dyspnea, DOE, pleurisy, hoarseness, laryngitis, wheezing.  Gastrointestinal: Denies dysphagia, heartburn, reflux, water brash, pain, cramps, nausea, vomiting, bloating, diarrhea, constipation, hematemesis, melena, hematochezia, jaundice, hemorrhoids Genitourinary: Denies dysuria, frequency, urgency, nocturia, hesitancy, discharge, hematuria, flank pain Breast: Breast lumps, nipple discharge, bleeding.  Musculoskeletal: Denies arthralgia, myalgia, stiffness, Jt. Swelling, pain, limp, and strain/sprain. Denies falls. Skin: Denies puritis, rash, hives, warts, acne, eczema, changing in skin lesion Neuro: No weakness, tremor, incoordination, spasms, paresthesia, pain Psychiatric: Denies confusion, memory loss, sensory loss. Denies Depression. Endocrine: Denies change in weight, skin, hair change, nocturia, and paresthesia, diabetic polys, visual blurring, hyper / hypo glycemic episodes.  Heme/Lymph: No excessive bleeding, bruising, enlarged lymph nodes.  Physical Exam  There were no vitals taken for this visit.  General Appearance: Well nourished, well groomed and in no apparent distress.  Eyes: PERRLA, EOMs, conjunctiva no swelling or erythema, normal fundi and vessels. Sinuses: No frontal/maxillary tenderness ENT/Mouth: EACs patent / TMs  nl. Nares  clear without erythema, swelling, mucoid exudates. Oral hygiene is good. No erythema, swelling, or exudate. Tongue normal, non-obstructing. Tonsils not swollen or erythematous. Hearing normal.  Neck: Supple, thyroid not palpable. No bruits, nodes or JVD. Respiratory: Respiratory effort normal.  BS equal and clear bilateral without rales, rhonci, wheezing or stridor. Cardio: Heart sounds are normal with regular rate and rhythm and no murmurs, rubs or gallops. Peripheral pulses are normal and equal bilaterally without edema. No aortic or femoral bruits. Chest: symmetric with normal excursions and percussion. Breasts: Symmetric, without lumps, nipple discharge, retractions, or fibrocystic changes.  Abdomen: Flat, soft with bowel sounds active. Nontender, no guarding, rebound, hernias, masses, or organomegaly.  Lymphatics: Non tender without lymphadenopathy.  Genitourinary:  Musculoskeletal: Full ROM all peripheral extremities, joint stability, 5/5 strength, and normal gait. Skin: Warm and dry without rashes, lesions, cyanosis, clubbing or  ecchymosis.  Neuro: Cranial nerves intact, reflexes equal bilaterally. Normal muscle tone, no cerebellar symptoms. Sensation intact.  Pysch: Alert and oriented X 3, normal affect, Insight and Judgment appropriate.    Assessment and Plan  1. Annual Preventative Screening  Examination            Patient was counseled in prudent diet to achieve/maintain BMI less than 25 for weight control, BP monitoring, regular exercise and medications. Discussed med's effects and SE's. Screening labs and tests as requested with regular follow-up as recommended. Over 40 minutes of exam, counseling, chart review and high complex critical decision making was performed.   Kirtland Bouchard, MD

## 2022-08-06 NOTE — Progress Notes (Unsigned)
Annual Screening/Preventative Visit &  Comprehensive Evaluation &  Examination  Future Appointments  Date Time Provider [place]  08/07/2022 11:00 AM Unk Pinto, MD [facility] None  08/11/2023 11:00 AM Unk Pinto, MD [facility] None        This very nice 71 y.o.female presents for a Screening /Preventative Visit & comprehensive evaluation and management of multiple medical co-morbidities.  Patient has been followed for HTN, HLD, T2_NIDDM  Prediabetes  and Vitamin D Deficiency.        HTN predates since     . Patient's BP has been controlled at home and patient denies any cardiac symptoms as chest pain, palpitations, shortness of breath, dizziness or ankle swelling. Today's          Patient's hyperlipidemia is controlled with diet and medications. Patient denies myalgias or other medication SE's. Last lipids were   Lab Results  Component Value Date   CHOL 178 08/07/2021   HDL 102 08/07/2021   LDLCALC 57 08/07/2021   TRIG 100 08/07/2021   CHOLHDL 1.7 08/07/2021         Patient has hx/o T2_NIDDM prediabetes predating since      and patient denies reactive hypoglycemic symptoms, visual blurring, diabetic polys or paresthesias. Last A1c was   Lab Results  Component Value Date   HGBA1C 5.2 02/07/2022         Finally, patient has history of Vitamin D Deficiency and last Vitamin D was   Lab Results  Component Value Date   VD25OH 28 08/07/2021     Current Outpatient Medications on File Prior to Visit  Medication Sig   alendronate (FOSAMAX) 70 MG tablet Take 1 tablet (70 mg total) by mouth every 7 (seven) days. Take with a full glass of water on an empty stomach.   Ascorbic Acid (VITAMIN C) 1000 MG tablet Take 1,000 mg by mouth daily.    Cholecalciferol (VITAMIN D) 125 MCG (5000 UT) CAPS Take by mouth.   Glucosamine-Chondroit-Vit C-Mn (GLUCOSAMINE 1500 COMPLEX PO) Take by mouth. (Patient not taking: Reported on 08/07/2021)   Multiple Vitamin  (MULTIVITAMIN) capsule Take 1 capsule by mouth daily.     scopolamine (TRANSDERM-SCOP, 1.5 MG,) 1 MG/3DAYS Place 1 patch (1.5 mg total) onto the skin every 3 (three) days.   No current facility-administered medications on file prior to visit.     Allergies  Allergen Reactions   Other     Cigarette smoke     Past Medical History:  Diagnosis Date   Essential thrombocytosis ([place])    Fibrocystic breast disease    Fibroids    Uterine   Hypertension    Migraines    Osteopenia      Health Maintenance  Topic Date Due   Medicare Annual Wellness (AWV)  Never done   Zoster Vaccines- Shingrix (1 of 2) Never done   INFLUENZA VACCINE  04/16/2022   COVID-19 Vaccine (5 - 2023-24 season) 05/17/2022   Hepatitis C Screening  02/08/2023 (Originally 08/18/1969)   MAMMOGRAM  10/25/2022   COLONOSCOPY (Pts 45-32yr Insurance coverage will need to be confirmed)  07/03/2026   Pneumonia Vaccine 71 Years old  Completed   DEXA SCAN  Completed   HPV VACCINES  Aged Out     Immunization History  Administered Date(s) Administered   Influenza Inj Mdck Quad 07/18/2017, 07/31/2018   Influenza, High Dose  08/05/2019, 08/07/2020, 08/07/2021   PFIZER Covid-19 Tri-Sucrose Vacc 12/18/2020   PFIZER\-SARS-COV-2 Vacc 11/07/2019, 12/01/2019   Pfizer Covid [employer]  06/06/2021   Pneumococcal  - 13 08/05/2019   Pneumococcal  - 23 08/07/2020   Td 11/07/2004   Tdap 06/06/2015     Last Colon -    Last MGM -    Past Surgical History:  Procedure Laterality Date   BREAST SURGERY     Left breast biopsy (benign)     Family History  Problem Relation Age of Onset   Breast cancer Mother        Breast   Atrial fibrillation Father    HIV Brother      Social History   Tobacco Use   Smoking status: Never   Smokeless tobacco: Never  Vaping Use   Vaping Use: Never used  Substance Use Topics   Alcohol use: Yes    Alcohol/week: 5.0 standard drinks of alcohol    Types: 5 Glasses of wine  per week   Drug use: No      ROS Constitutional: Denies fever, chills, weight loss/gain, headaches, insomnia,  night sweats, and change in appetite. Does c/o fatigue. Eyes: Denies redness, blurred vision, diplopia, discharge, itchy, watery eyes.  ENT: Denies discharge, congestion, post nasal drip, epistaxis, sore throat, earache, hearing loss, dental pain, Tinnitus, Vertigo, Sinus pain, snoring.  Cardio: Denies chest pain, palpitations, irregular heartbeat, syncope, dyspnea, diaphoresis, orthopnea, PND, claudication, edema Respiratory: denies cough, dyspnea, DOE, pleurisy, hoarseness, laryngitis, wheezing.  Gastrointestinal: Denies dysphagia, heartburn, reflux, water brash, pain, cramps, nausea, vomiting, bloating, diarrhea, constipation, hematemesis, melena, hematochezia, jaundice, hemorrhoids Genitourinary: Denies dysuria, frequency, urgency, nocturia, hesitancy, discharge, hematuria, flank pain Breast: Breast lumps, nipple discharge, bleeding.  Musculoskeletal: Denies arthralgia, myalgia, stiffness, Jt. Swelling, pain, limp, and strain/sprain. Denies falls. Skin: Denies puritis, rash, hives, warts, acne, eczema, changing in skin lesion Neuro: No weakness, tremor, incoordination, spasms, paresthesia, pain Psychiatric: Denies confusion, memory loss, sensory loss. Denies Depression. Endocrine: Denies change in weight, skin, hair change, nocturia, and paresthesia, diabetic polys, visual blurring, hyper / hypo glycemic episodes.  Heme/Lymph: No excessive bleeding, bruising, enlarged lymph nodes.  Physical Exam  There were no vitals taken for this visit.  General Appearance: Well nourished, well groomed and in no apparent distress.  Eyes: PERRLA, EOMs, conjunctiva no swelling or erythema, normal fundi and vessels. Sinuses: No frontal/maxillary tenderness ENT/Mouth: EACs patent / TMs  nl. Nares clear without erythema, swelling, mucoid exudates. Oral hygiene is good. No erythema, swelling, or  exudate. Tongue normal, non-obstructing. Tonsils not swollen or erythematous. Hearing normal.  Neck: Supple, thyroid not palpable. No bruits, nodes or JVD. Respiratory: Respiratory effort normal.  BS equal and clear bilateral without rales, rhonci, wheezing or stridor. Cardio: Heart sounds are normal with regular rate and rhythm and no murmurs, rubs or gallops. Peripheral pulses are normal and equal bilaterally without edema. No aortic or femoral bruits. Chest: symmetric with normal excursions and percussion. Breasts: Symmetric, without lumps, nipple discharge, retractions, or fibrocystic changes.  Abdomen: Flat, soft with bowel sounds active. Nontender, no guarding, rebound, hernias, masses, or organomegaly.  Lymphatics: Non tender without lymphadenopathy.  Genitourinary:  Musculoskeletal: Full ROM all peripheral extremities, joint stability, 5/5 strength, and normal gait. Skin: Warm and dry without rashes, lesions, cyanosis, clubbing or  ecchymosis.  Neuro: Cranial nerves intact, reflexes equal bilaterally. Normal muscle tone, no cerebellar symptoms. Sensation intact.  Pysch: Alert and oriented X 3, normal affect, Insight and Judgment appropriate.    Assessment and Plan  1. Annual Preventative Screening Examination            Patient was counseled in  prudent diet to achieve/maintain BMI less than 25 for weight control, BP monitoring, regular exercise and medications. Discussed med's effects and SE's. Screening labs and tests as requested with regular follow-up as recommended. Over 40 minutes of exam, counseling, chart review and high complex critical decision making was performed.   Kirtland Bouchard, MD

## 2022-08-07 ENCOUNTER — Ambulatory Visit (INDEPENDENT_AMBULATORY_CARE_PROVIDER_SITE_OTHER): Payer: Medicare Other | Admitting: Internal Medicine

## 2022-08-07 ENCOUNTER — Encounter: Payer: Medicare Other | Admitting: Nurse Practitioner

## 2022-08-07 ENCOUNTER — Encounter: Payer: Self-pay | Admitting: Internal Medicine

## 2022-08-07 VITALS — BP 126/80 | HR 62 | Temp 97.6°F | Resp 16 | Ht 68.0 in | Wt 145.0 lb

## 2022-08-07 DIAGNOSIS — N1831 Chronic kidney disease, stage 3a: Secondary | ICD-10-CM

## 2022-08-07 DIAGNOSIS — Z8249 Family history of ischemic heart disease and other diseases of the circulatory system: Secondary | ICD-10-CM

## 2022-08-07 DIAGNOSIS — R0989 Other specified symptoms and signs involving the circulatory and respiratory systems: Secondary | ICD-10-CM | POA: Diagnosis not present

## 2022-08-07 DIAGNOSIS — Z136 Encounter for screening for cardiovascular disorders: Secondary | ICD-10-CM | POA: Diagnosis not present

## 2022-08-07 DIAGNOSIS — E559 Vitamin D deficiency, unspecified: Secondary | ICD-10-CM | POA: Diagnosis not present

## 2022-08-07 DIAGNOSIS — R7309 Other abnormal glucose: Secondary | ICD-10-CM

## 2022-08-07 DIAGNOSIS — E7889 Other lipoprotein metabolism disorders: Secondary | ICD-10-CM

## 2022-08-07 DIAGNOSIS — M810 Age-related osteoporosis without current pathological fracture: Secondary | ICD-10-CM

## 2022-08-07 DIAGNOSIS — Z1211 Encounter for screening for malignant neoplasm of colon: Secondary | ICD-10-CM

## 2022-08-07 DIAGNOSIS — Z79899 Other long term (current) drug therapy: Secondary | ICD-10-CM

## 2022-08-07 NOTE — Patient Instructions (Signed)

## 2022-08-08 LAB — CBC WITH DIFFERENTIAL/PLATELET
Absolute Monocytes: 513 cells/uL (ref 200–950)
Basophils Absolute: 72 cells/uL (ref 0–200)
Basophils Relative: 1.6 %
Eosinophils Absolute: 122 cells/uL (ref 15–500)
Eosinophils Relative: 2.7 %
HCT: 39.4 % (ref 35.0–45.0)
Hemoglobin: 13.3 g/dL (ref 11.7–15.5)
Lymphs Abs: 1409 cells/uL (ref 850–3900)
MCH: 31.7 pg (ref 27.0–33.0)
MCHC: 33.8 g/dL (ref 32.0–36.0)
MCV: 93.8 fL (ref 80.0–100.0)
MPV: 11.8 fL (ref 7.5–12.5)
Monocytes Relative: 11.4 %
Neutro Abs: 2385 cells/uL (ref 1500–7800)
Neutrophils Relative %: 53 %
Platelets: 153 10*3/uL (ref 140–400)
RBC: 4.2 10*6/uL (ref 3.80–5.10)
RDW: 11.9 % (ref 11.0–15.0)
Total Lymphocyte: 31.3 %
WBC: 4.5 10*3/uL (ref 3.8–10.8)

## 2022-08-08 LAB — URINALYSIS, ROUTINE W REFLEX MICROSCOPIC
Bilirubin Urine: NEGATIVE
Glucose, UA: NEGATIVE
Hgb urine dipstick: NEGATIVE
Ketones, ur: NEGATIVE
Leukocytes,Ua: NEGATIVE
Nitrite: NEGATIVE
Protein, ur: NEGATIVE
Specific Gravity, Urine: 1.009 (ref 1.001–1.035)
pH: 7 (ref 5.0–8.0)

## 2022-08-08 LAB — COMPLETE METABOLIC PANEL WITH GFR
AG Ratio: 1.8 (calc) (ref 1.0–2.5)
ALT: 16 U/L (ref 6–29)
AST: 21 U/L (ref 10–35)
Albumin: 4.4 g/dL (ref 3.6–5.1)
Alkaline phosphatase (APISO): 46 U/L (ref 37–153)
BUN: 16 mg/dL (ref 7–25)
CO2: 28 mmol/L (ref 20–32)
Calcium: 9.8 mg/dL (ref 8.6–10.4)
Chloride: 104 mmol/L (ref 98–110)
Creat: 1 mg/dL (ref 0.50–1.05)
Globulin: 2.4 g/dL (calc) (ref 1.9–3.7)
Glucose, Bld: 97 mg/dL (ref 65–99)
Potassium: 4.4 mmol/L (ref 3.5–5.3)
Sodium: 140 mmol/L (ref 135–146)
Total Bilirubin: 0.6 mg/dL (ref 0.2–1.2)
Total Protein: 6.8 g/dL (ref 6.1–8.1)
eGFR: 61 mL/min/{1.73_m2} (ref >=60)

## 2022-08-08 LAB — HEMOGLOBIN A1C
Hgb A1c MFr Bld: 5.6 % of total Hgb (ref <5.7)
Mean Plasma Glucose: 114 mg/dL
eAG (mmol/L): 6.3 mmol/L

## 2022-08-08 LAB — MAGNESIUM: Magnesium: 2.1 mg/dL (ref 1.5–2.5)

## 2022-08-08 LAB — LIPID PANEL
Cholesterol: 181 mg/dL (ref <200)
HDL: 108 mg/dL (ref >=50)
LDL Cholesterol (Calc): 59 mg/dL (calc)
Non-HDL Cholesterol (Calc): 73 mg/dL (calc) (ref <130)
Total CHOL/HDL Ratio: 1.7 (calc) (ref <5.0)
Triglycerides: 67 mg/dL (ref <150)

## 2022-08-08 LAB — MICROALBUMIN / CREATININE URINE RATIO
Creatinine, Urine: 38 mg/dL (ref 20–275)
Microalb, Ur: <0.2 mg/dL

## 2022-08-08 LAB — VITAMIN D 25 HYDROXY (VIT D DEFICIENCY, FRACTURES): Vit D, 25-Hydroxy: 69 ng/mL (ref 30–100)

## 2022-08-08 LAB — INSULIN, RANDOM: Insulin: 4 u[IU]/mL

## 2022-08-08 LAB — TSH: TSH: 1.08 mIU/L (ref 0.40–4.50)

## 2022-08-10 NOTE — Progress Notes (Signed)
=== ===   Text of the result note when timeout occurred === ===    <><><><><><><><><><><><><><><><><><><><><><><><><><><><><><><><><> <><><><><><><><><><><><><><><><><><><><><><><><><><><><><><><><><> - Test results slightly outside the reference range are not unusual.  If there is anything important, I will review this with you,  otherwise it is considered normal test values.  If you have further questions,  please do not hesitate to contact me at the office or via My Chart.  <><><><><><><><><><><><><><><><><><><><><><><><><><><><><><><><><>  === === Text of the result note when timeout occurred === ===    <><><><><><><><><><><><><><><><><><><><><><><><><><><><><><><><><> <><><><><><><><><><><><><><><><><><><><><><><><><><><><><><><><><> - Test results slightly outside the reference range are not unusual.  If there is anything important, I will review this with you,  otherwise it is considered normal test values.  If you have further questions,  please do not hesitate to contact me at the office or via My Chart.  <><><><><><><><><><><><><><><><><><><><><><><><><><><><><><><><><> <><><><><><><><><><><><><><><><><><><><><><><><><><><><><><><><><>  -  Total Chol = 181     &    LDL Chol = 59    -    Both     Excellent   - Very low risk for Heart Attack  / Stroke  <><><><><><><><><><><><><><><><><><><><><><><><><><><><><><><><><>  -  A1c - Normal  - Great  - No Diabetes  !  <><><><><><><><><><><><><><><><><><><><><><><><><><><><><><><><><>  -  Vitamin  D = 69  - Which is Excellent   - Vitamin D goal is between  65 -100.   - It is very important as a natural anti-inflammatory and helping the                                    immune system protect against viral infections, like the Covid-19    helping hair, skin, and nails, as well as reducing stroke and heart attack risk.   - It helps your bones and helps with mood.  - It also decreases numerous cancer risks so please                                                                                    Continue it same  !   - Low Vit D is associated with a 200-300% higher risk for' \\CANCER'$    and 200-300% higher risk for [place].    - It is also associated with higher death rate at younger ages,   autoimmune diseases like Rheumatoid arthritis, Lupus, Multiple Sclerosis.     - Also many other serious conditions, like depression, Alzheimer's Dementia,                                                                           muscle aches, fatigue, fibromyalgia  <><><><><><><><><><><><><><><><><><><><><><><><><><><><><><><><><> <><><><><><><><><><><><><><><><><><><><><><><><><><><><><><><><><>  -  All Excellent  !  - Keep up the Great work with Eating Healthy !  <><><><><><><><><><><><><><><><><><><><><><><><><><><><><><><><><> <><><><><><><><><><><><><><><><><><><><><><><><><><><><><><><><><>

## 2022-08-12 ENCOUNTER — Ambulatory Visit (INDEPENDENT_AMBULATORY_CARE_PROVIDER_SITE_OTHER): Payer: Medicare Other

## 2022-08-12 VITALS — Temp 97.1°F

## 2022-08-12 DIAGNOSIS — Z23 Encounter for immunization: Secondary | ICD-10-CM

## 2022-08-14 ENCOUNTER — Encounter: Payer: Medicare Other | Admitting: Nurse Practitioner

## 2022-11-06 LAB — HM DEXA SCAN

## 2022-11-06 LAB — HM MAMMOGRAPHY

## 2022-11-12 ENCOUNTER — Encounter: Payer: Self-pay | Admitting: Internal Medicine

## 2022-11-15 ENCOUNTER — Encounter: Payer: Self-pay | Admitting: Internal Medicine

## 2022-11-20 ENCOUNTER — Encounter: Payer: Self-pay | Admitting: Internal Medicine

## 2023-02-07 ENCOUNTER — Ambulatory Visit: Payer: Medicare Other | Admitting: Nurse Practitioner

## 2023-02-18 ENCOUNTER — Ambulatory Visit: Payer: Medicare Other | Admitting: Nurse Practitioner

## 2023-02-20 NOTE — Progress Notes (Signed)
6 month follow up and MEDICARE Annual Wellness  Assessment and Plan:  Encounter for Medicare annual wellness exam 1 year  Thrombocytopenia (HCC) -     CBC with Diff - mild, stable, continue to monitor  Medication management -     CBC with Diff -     COMPLETE METABOLIC PANEL WITH GFR -     TSH  Essential hypertension - continue DASH diet, exercise and monitor at home. Call if greater than 130/80.  -     TSH  Vitamin D deficiency Continue Vit D supplementation to maintain value in therapeutic level of 60-100   Fibrocystic breast disease (FCBD), unspecified laterality Monitor; getting routine mammograms last 10/25/21 benign repeat 1 year  Osteopenia Improved from osteoporosis score 2021 Has desire to continue Fosamax Continue Vit D and weight bearing exercises .  Other migraine without status migrainosus, not intractable No issues currently  Dupuytren's contracture Had injection by Dr. Dierdre Searles continue to follow, did not last more than 6 weeks Has appointment for second opinion with a new hand specialist this week  Onychomycosis Fungal changes of several toenails Use Lamisil  -     terbinafine (LAMISIL) 250 MG tablet; Take 1 tablet (250 mg total) by mouth daily. Take 1 pill daily for one month, off for 1 month, one a month, etc until gone      Discussed med's effects and SE's. Screening labs and tests as requested with regular follow-up as recommended. Future Appointments  Date Time Provider Department Center  08/11/2023 11:00 AM Lucky Cowboy, MD [facility] None  02/25/2024  2:30 PM Raynelle Dick, NP [facility] None    Plan:   During the course of the visit the patient was educated and counseled about appropriate screening and preventive services including:   Pneumococcal vaccine  Prevnar 13 Influenza vaccine Td vaccine Screening electrocardiogram Bone densitometry screening Colorectal cancer screening Diabetes screening Glaucoma screening Nutrition  counseling  Advanced directives: requested  HPI 72 y.o. female  presents for a complete physical and medicare visit. She has Osteoporosis; Migraines; Hypertension; Thrombocytopenia (HCC); Fibrocystic breast disease; Vitamin D deficiency; Medication management; and Family history of hypertension on their problem list.  Husband with parkinson's- doing fairly. Married 43 years, 2 sons, no grandchildren.  She is still working as Armed forces logistics/support/administrative officer, own business.   She has hx of migraines, has not have for a long time.  She had a previous injection with Dr. Dierdre Searles [facility]) for Dupuytren's  Lasted about 6 weeks but again unable to play cello.  Has plan to see another hand specialist. 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   She had DEXA 10/14/2019 showing L fem T -2.8, she is on vitamin D,. Repeat 2024 showed osteopenia T score -2.2. She wishes to continue on Fosamax  BMI is Body mass index is 22.38 kg/m., she has been working on diet and exercise. Wt Readings from Last 3 Encounters:  02/24/23 147 lb 3.2 oz (66.8 kg)  08/07/22 145 lb (65.8 kg)  02/07/22 142 lb 3.2 oz (64.5 kg)   Her blood pressure has been controlled at home, today their BP is BP: 112/62   BP Readings from Last 3 Encounters:  02/24/23 112/62  08/07/22 126/80  02/07/22 118/70  She does workout, walks a lot. She denies chest pain, shortness of breath, dizziness.    She is not on cholesterol medication and denies myalgias. Her cholesterol is at goal. The cholesterol last visit was: Lab Results  Component Value Date   CHOL 181 08/07/2022   HDL 108 08/07/2022   LDLCALC 59 08/07/2022   TRIG 67 08/07/2022   CHOLHDL 1.7 08/07/2022   Last W2N in the office was: Lab Results  Component Value Date   HGBA1C 5.6 08/07/2022    Last GFR:  Lab Results  Component Value Date   EGFR 61 08/07/2022    Patient is on Vitamin D supplement, 4000- 5000 IU daily.  Lab Results  Component Value Date   VD25OH  69 08/07/2022       Current Medications:  Current Outpatient Medications on File Prior to Visit  Medication Sig Dispense Refill   alendronate (FOSAMAX) 70 MG tablet Take 1 tablet (70 mg total) by mouth every 7 (seven) days. Take with a full glass of water on an empty stomach. 4 tablet 11   Ascorbic Acid (VITAMIN C) 1000 MG tablet Take 1,000 mg by mouth daily.      Cholecalciferol (VITAMIN D) 125 MCG (5000 UT) CAPS Take by mouth.     Multiple Vitamin (MULTIVITAMIN) capsule Take 1 capsule by mouth daily.       No current facility-administered medications on file prior to visit.   Health Maintenance:   Immunization History  Administered Date(s) Administered   Influenza Inj Mdck Quad With Preservative 07/18/2017, 07/31/2018   Influenza, High Dose Seasonal PF 08/05/2019, 08/07/2020, 08/07/2021, 08/12/2022   PFIZER Comirnaty(Gray Top)Covid-19 Tri-Sucrose Vaccine 12/18/2020, 10/02/2022   PFIZER(Purple Top)SARS-COV-2 Vaccination 11/07/2019, 12/01/2019   Pfizer Covid-19 Vaccine Bivalent Booster 79yrs & up 06/06/2021   Pneumococcal Conjugate-13 08/05/2019   Pneumococcal Polysaccharide-23 08/07/2020   Td 11/07/2004   Tdap 06/06/2015   Tetanus: 2016 Pneumovax: 2021 Prevnar 13: 07/2019 Flu vaccine: 07/2021 Shingrix: discussed,  Covid 19: 2/2, 2021, pfizer - send info about booster   Pap: 2016 negative HPV, normal test, never abnormal, does not need again 3DMGM: 09/2019 [facility] - schedule next year DEXA: 09/2019 [facility], L fem T -2.8 - starting alendronate 07/2020  Colonoscopy: 07/03/2016 Dr. Loreta Ave, due 10 years Last eye: Dr. Burundi, 08/2022 Last dental: Dr. Briscoe Deutscher 02/2023  Medical History:  Past Medical History:  Diagnosis Date   Essential thrombocytosis (HCC)    Fibrocystic breast disease    Fibroids    Uterine   Hypertension    Migraines    Osteopenia    Allergies Allergies  Allergen Reactions   Other     Cigarette smoke    SURGICAL HISTORY She  has a past surgical history  that includes Breast surgery. FAMILY HISTORY Her family history includes Atrial fibrillation in her father; Breast cancer in her mother; HIV in her brother. SOCIAL HISTORY She  reports that she has never smoked. She has never used smokeless tobacco. She reports current alcohol use of about 5.0 standard drinks of alcohol per week. She reports that she does not use drugs.  MEDICARE WELLNESS OBJECTIVES: Physical activity: Current Exercise Habits: Home exercise routine, Type of exercise: walking, Time (Minutes): 50, Frequency (Times/Week): 7, Weekly Exercise (Minutes/Week): 350, Intensity: Mild, Exercise limited by: orthopedic condition(s) Cardiac risk factors: Cardiac Risk Factors include: advanced age (>87men, >31 women) Depression/mood screen:      02/24/2023   10:11 AM  Depression screen PHQ 2/9  Decreased Interest 0  Down, Depressed, Hopeless 0  PHQ - 2 Score 0    ADLs:     02/24/2023   10:12 AM 08/07/2022   12:37 AM  In your present state of health, do you have any difficulty performing the following  activities:  Hearing? 0 0  Vision? 0 0  Difficulty concentrating or making decisions? 0 0  Walking or climbing stairs? 0 0  Dressing or bathing? 0 0  Doing errands, shopping? 0 0     Cognitive Testing  Alert? Yes  Normal Appearance?Yes  Oriented to person? Yes  Place? Yes   Time? Yes  Recall of three objects?  Yes  Can perform simple calculations? Yes  Displays appropriate judgment?Yes  Can read the correct time from a watch face?Yes  EOL planning: Does Patient Have a Medical Advance Directive?: Yes Type of Advance Directive: Healthcare Power of Attorney, Living will Does patient want to make changes to medical advance directive?: No - Patient declined Copy of Healthcare Power of Attorney in Chart?: No - copy requested   Review of Systems  Constitutional:  Negative for malaise/fatigue and weight loss.  HENT:  Negative for hearing loss and tinnitus.   Eyes:  Negative for  blurred vision and double vision.  Respiratory:  Negative for cough, sputum production, shortness of breath and wheezing.   Cardiovascular:  Negative for chest pain, palpitations, orthopnea, claudication, leg swelling and PND.  Gastrointestinal:  Negative for abdominal pain, blood in stool, constipation, diarrhea, heartburn, melena, nausea and vomiting.  Genitourinary: Negative.   Musculoskeletal:  Negative for falls, joint pain (L hand) and myalgias.  Skin:  Negative for rash.       Fungal changes of toenails  Neurological:  Negative for dizziness, tingling, sensory change, weakness and headaches.  Endo/Heme/Allergies:  Negative for polydipsia.  Psychiatric/Behavioral: Negative.  Negative for depression, memory loss, substance abuse and suicidal ideas. The patient is not nervous/anxious and does not have insomnia.   All other systems reviewed and are negative.  Physical Exam: Estimated body mass index is 22.38 kg/m as calculated from the following:   Height as of this encounter: 5\' 8"  (1.727 m).   Weight as of this encounter: 147 lb 3.2 oz (66.8 kg). BP 112/62   Pulse (!) 55   Temp 97.7 F (36.5 C)   Ht 5\' 8"  (1.727 m)   Wt 147 lb 3.2 oz (66.8 kg)   SpO2 98%   BMI 22.38 kg/m  General Appearance: Well nourished, in no apparent distress. Eyes: PERRLA, EOMs, conjunctiva no swelling or erythema Sinuses: No Frontal/maxillary tenderness ENT/Mouth: Ext aud canals clear, normal light reflex with TMs without erythema, bulging, right TM crinkled, no erythema, swelling, etc..  Good dentition. No erythema, swelling, or exudate on post pharynx. Tonsils not swollen or erythematous. Hearing normal.  Neck: Supple, thyroid normal. No bruits Respiratory: Respiratory effort normal, BS equal bilaterally without rales, rhonchi, wheezing or stridor. Cardio: RRR without murmurs, rubs or gallops. Brisk peripheral pulses without edema.  Chest: symmetric, with normal excursions and percussion. Abdomen:  Soft, +BS. Non tender, no guarding, rebound, hernias, masses, or organomegaly.  Lymphatics: Non tender without lymphadenopathy.  Musculoskeletal: Full ROM all peripheral extremities,5/5 strength, and normal gait. Left palm with fibrosis,  restriction of ring finger on left  Skin: Warm, dry . Fungal changes of multiple toenails.  Neuro: Cranial nerves intact, reflexes equal bilaterally. Normal muscle tone, no cerebellar symptoms. Sensation intact.  Psych: Awake and oriented X 3, normal affect, Insight and Judgment appropriate.     Medicare Attestation I have personally reviewed: The patient's medical and social history Their use of alcohol, tobacco or illicit drugs Their current medications and supplements The patient's functional ability including ADLs,fall risks, home safety risks, cognitive, and hearing and visual impairment Diet  and physical activities Evidence for depression or mood disorders  The patient's weight, height, BMI, and visual acuity have been recorded in the chart.  I have made referrals, counseling, and provided education to the patient based on review of the above and I have provided the patient with a written personalized care plan for preventive services.     Revonda Humphrey ANP-C  [facility]  02/24/2023

## 2023-02-24 ENCOUNTER — Ambulatory Visit (INDEPENDENT_AMBULATORY_CARE_PROVIDER_SITE_OTHER): Payer: Medicare Other | Admitting: Nurse Practitioner

## 2023-02-24 ENCOUNTER — Encounter: Payer: Self-pay | Admitting: Nurse Practitioner

## 2023-02-24 VITALS — BP 112/62 | HR 55 | Temp 97.7°F | Ht 68.0 in | Wt 147.2 lb

## 2023-02-24 DIAGNOSIS — E559 Vitamin D deficiency, unspecified: Secondary | ICD-10-CM

## 2023-02-24 DIAGNOSIS — R6889 Other general symptoms and signs: Secondary | ICD-10-CM

## 2023-02-24 DIAGNOSIS — Z0001 Encounter for general adult medical examination with abnormal findings: Secondary | ICD-10-CM

## 2023-02-24 DIAGNOSIS — D696 Thrombocytopenia, unspecified: Secondary | ICD-10-CM

## 2023-02-24 DIAGNOSIS — Z0000 Encounter for general adult medical examination without abnormal findings: Secondary | ICD-10-CM

## 2023-02-24 DIAGNOSIS — G43809 Other migraine, not intractable, without status migrainosus: Secondary | ICD-10-CM

## 2023-02-24 DIAGNOSIS — N6019 Diffuse cystic mastopathy of unspecified breast: Secondary | ICD-10-CM | POA: Diagnosis not present

## 2023-02-24 DIAGNOSIS — Z79899 Other long term (current) drug therapy: Secondary | ICD-10-CM | POA: Diagnosis not present

## 2023-02-24 DIAGNOSIS — I10 Essential (primary) hypertension: Secondary | ICD-10-CM

## 2023-02-24 DIAGNOSIS — B351 Tinea unguium: Secondary | ICD-10-CM

## 2023-02-24 DIAGNOSIS — M720 Palmar fascial fibromatosis [Dupuytren]: Secondary | ICD-10-CM

## 2023-02-24 DIAGNOSIS — M810 Age-related osteoporosis without current pathological fracture: Secondary | ICD-10-CM

## 2023-02-24 MED ORDER — TERBINAFINE HCL 250 MG PO TABS
250.0000 mg | ORAL_TABLET | Freq: Every day | ORAL | 0 refills | Status: DC
Start: 2023-02-24 — End: 2023-06-03

## 2023-02-24 NOTE — Patient Instructions (Signed)

## 2023-02-25 LAB — CBC WITH DIFFERENTIAL/PLATELET
Absolute Monocytes: 540 cells/uL (ref 200–950)
Basophils Absolute: 40 cells/uL (ref 0–200)
Basophils Relative: 0.8 %
Eosinophils Absolute: 130 cells/uL (ref 15–500)
Eosinophils Relative: 2.6 %
HCT: 41 % (ref 35.0–45.0)
Hemoglobin: 13.6 g/dL (ref 11.7–15.5)
Lymphs Abs: 1360 cells/uL (ref 850–3900)
MCH: 31.1 pg (ref 27.0–33.0)
MCHC: 33.2 g/dL (ref 32.0–36.0)
MCV: 93.6 fL (ref 80.0–100.0)
MPV: 11.8 fL (ref 7.5–12.5)
Monocytes Relative: 10.8 %
Neutro Abs: 2930 cells/uL (ref 1500–7800)
Neutrophils Relative %: 58.6 %
Platelets: 161 10*3/uL (ref 140–400)
RBC: 4.38 10*6/uL (ref 3.80–5.10)
RDW: 12.2 % (ref 11.0–15.0)
Total Lymphocyte: 27.2 %
WBC: 5 10*3/uL (ref 3.8–10.8)

## 2023-02-25 LAB — COMPLETE METABOLIC PANEL WITH GFR
AG Ratio: 1.7 (calc) (ref 1.0–2.5)
ALT: 17 U/L (ref 6–29)
AST: 21 U/L (ref 10–35)
Albumin: 4.2 g/dL (ref 3.6–5.1)
Alkaline phosphatase (APISO): 51 U/L (ref 37–153)
BUN: 18 mg/dL (ref 7–25)
CO2: 27 mmol/L (ref 20–32)
Calcium: 9.7 mg/dL (ref 8.6–10.4)
Chloride: 106 mmol/L (ref 98–110)
Creat: 0.98 mg/dL (ref 0.50–1.05)
Globulin: 2.5 g/dL (calc) (ref 1.9–3.7)
Glucose, Bld: 94 mg/dL (ref 65–99)
Potassium: 4.7 mmol/L (ref 3.5–5.3)
Sodium: 143 mmol/L (ref 135–146)
Total Bilirubin: 0.5 mg/dL (ref 0.2–1.2)
Total Protein: 6.7 g/dL (ref 6.1–8.1)
eGFR: 62 mL/min/{1.73_m2} (ref >=60)

## 2023-02-25 LAB — LIPID PANEL
Cholesterol: 169 mg/dL (ref <200)
HDL: 101 mg/dL (ref >=50)
LDL Cholesterol (Calc): 51 mg/dL (calc)
Non-HDL Cholesterol (Calc): 68 mg/dL (calc) (ref <130)
Total CHOL/HDL Ratio: 1.7 (calc) (ref <5.0)
Triglycerides: 89 mg/dL (ref <150)

## 2023-02-25 LAB — TSH: TSH: 0.79 mIU/L (ref 0.40–4.50)

## 2023-03-24 ENCOUNTER — Ambulatory Visit (INDEPENDENT_AMBULATORY_CARE_PROVIDER_SITE_OTHER): Payer: Medicare Other

## 2023-03-24 DIAGNOSIS — B351 Tinea unguium: Secondary | ICD-10-CM

## 2023-03-24 DIAGNOSIS — Z79899 Other long term (current) drug therapy: Secondary | ICD-10-CM

## 2023-03-24 NOTE — Progress Notes (Signed)
The patient came in for repeat labs today. She reports no new issues or concerns at this nurse visit other than her weight not decreasing with lots of walking. She did express that her clothes are fitting looser.

## 2023-03-25 LAB — HEPATIC FUNCTION PANEL
AG Ratio: 1.8 (calc) (ref 1.0–2.5)
ALT: 16 U/L (ref 6–29)
AST: 19 U/L (ref 10–35)
Albumin: 4.2 g/dL (ref 3.6–5.1)
Alkaline phosphatase (APISO): 49 U/L (ref 37–153)
Bilirubin, Direct: 0.1 mg/dL (ref 0.0–0.2)
Globulin: 2.3 g/dL (calc) (ref 1.9–3.7)
Indirect Bilirubin: 0.2 mg/dL (calc) (ref 0.2–1.2)
Total Bilirubin: 0.3 mg/dL (ref 0.2–1.2)
Total Protein: 6.5 g/dL (ref 6.1–8.1)

## 2023-04-14 ENCOUNTER — Telehealth: Payer: Self-pay | Admitting: Nurse Practitioner

## 2023-04-14 NOTE — Telephone Encounter (Signed)
Should take for 30 days on and 30 days off.  90 pills were prescribed in June which should last 6 months

## 2023-04-14 NOTE — Telephone Encounter (Signed)
Requesting refill on Lamisil. Pls send to [facility] on file.

## 2023-04-15 ENCOUNTER — Other Ambulatory Visit: Payer: Self-pay | Admitting: Orthopedic Surgery

## 2023-05-27 ENCOUNTER — Other Ambulatory Visit: Payer: Self-pay | Admitting: Nurse Practitioner

## 2023-05-27 DIAGNOSIS — M810 Age-related osteoporosis without current pathological fracture: Secondary | ICD-10-CM

## 2023-06-03 ENCOUNTER — Telehealth: Payer: Self-pay | Admitting: Nurse Practitioner

## 2023-06-03 ENCOUNTER — Other Ambulatory Visit: Payer: Self-pay | Admitting: Nurse Practitioner

## 2023-06-03 DIAGNOSIS — B351 Tinea unguium: Secondary | ICD-10-CM

## 2023-06-03 MED ORDER — TERBINAFINE HCL 250 MG PO TABS: 250.0000 mg | ORAL_TABLET | Freq: Every day | ORAL | 0 refills | Status: DC

## 2023-06-03 NOTE — Telephone Encounter (Signed)
Requesting refill on Lamisil. Pls send to [facility] off [address] on file.

## 2023-08-10 ENCOUNTER — Encounter: Payer: Self-pay | Admitting: Internal Medicine

## 2023-08-10 NOTE — Progress Notes (Unsigned)
[facility]  Lucky Cowboy, M.D.        Rance Muir, A.NP      Adela Glimpse, F.NP  [facility] [address]  [place], [place]. [address] Telephone [phone] Telefax [phone]  Annual Screening/Preventative Visit &  Comprehensive Evaluation &  Examination   Future Appointments  Date Time Provider Department  08/11/2023                   cpe 11:00 AM Lucky Cowboy, MD [facility]  02/25/2024                     wellness  2:30 PM Raynelle Dick, NP [facility]  08/17/2024                     cpe 11:00 AM Lucky Cowboy, MD [facility]        This very nice 72 y.o. MWF with  expectant labile HTN,  abn lipids and Vitamin D Deficiency presents for a Screening /Preventative Visit & comprehensive evaluation and management of multiple medical co-morbidities.  Patient is on Alendronate for Osteoporosis since  Sept 2023.          Labile HTN predates circa 2000.   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 Patient's BP has been controlled at home and patient denies any cardiac symptoms as chest pain, palpitations, shortness of breath, dizziness or ankle swelling. Today's BP is at goal - 124/78 .        Patient's hyperlipidemia is controlled with diet . Last lipids were at goal :  Lab Results  Component Value Date   CHOL 169 02/24/2023   HDL 101 02/24/2023   LDLCALC 51 02/24/2023   TRIG 89 02/24/2023   CHOLHDL 1.7 02/24/2023         Patient is followed expectantly for glucose intolerance  and patient denies reactive hypoglycemic symptoms, visual blurring, diabetic polys or paresthesias. Last A1c was normal & at goal :  Lab Results  Component Value Date   HGBA1C 5.6 08/07/2022         Finally, patient has history of Vitamin D Deficiency and last Vitamin D was  at goal :  Lab Results  Component Value Date   VD25OH 69 08/07/2022       Current Outpatient Medications on File Prior to  Visit  Medication Sig     alendronate (FOSAMAX) 70 MG (Sept 2023)  Take 1 tablet  every 7 days    VITAMIN C 1000 MG tablet Take  daily.     VITAMIN D  5000  u Take daily    Multiple Vitamin Take 1 capsule  daily.       Allergies  Allergen Reactions   Other     Cigarette smoke     Past Medical History:  Diagnosis Date   Essential thrombocytosis (HCC)    Fibrocystic breast disease    Fibroids    Uterine   Hypertension    Migraines    Osteopenia      Health Maintenance  Topic Date Due   Medicare Annual Wellness (AWV)  Never done   Zoster Vaccines- Shingrix (1 of 2) Never done   INFLUENZA VACCINE  04/16/2022   COVID-19 Vaccine (5 - 2023-24 season) 05/17/2022   Hepatitis C Screening  02/08/2023 (Originally 08/18/1969)   MAMMOGRAM  10/25/2022   Pneumonia Vaccine 32+ Years old  Completed   DEXA SCAN  Completed   HPV VACCINES  Aged Out     Immunization History  Administered Date(s) Administered   Influenza Inj Mdck Quad 07/18/2017, 07/31/2018   Influenza, High Dose  08/05/2019, 08/07/2020, 08/07/2021   PFIZER Covid-19 Tri-Sucrose Vacc 12/18/2020   PFIZER-SARS-COV-2 Vacc 11/07/2019, 12/01/2019   Pfizer Covid Bivalent Booster  06/06/2021   Pneumococcal  - 13 08/05/2019   Pneumococcal  - 23 08/07/2020   Td 11/07/2004   Tdap 06/06/2015    Last Colon - 07/03/2016 - [facility] - Recc 10 yr f/u - due Oct 2027   Last MGM - 11/06/2022 - Normal    DexaBMD - 10/12/2019 Osteoporosis (T-2.8)   Last DexaBMD - 11/06/2022 Osteopenia (T-2.0)  Improved !                              - Recc 2 year f/u Due Mar 2026   Past Surgical History:  Procedure Laterality Date   BREAST SURGERY     Left breast biopsy (benign)     Family History  Problem Relation Age of Onset   Breast cancer Mother        Breast   Atrial fibrillation Father    HIV Brother      Social History   Tobacco Use   Smoking status: Never   Smokeless tobacco: Never  Vaping Use   Vaping Use: Never used   Substance Use Topics   Alcohol use: Yes    Alcohol/week: 5.0 standard drinks of alcohol    Types: 5 Glasses of wine per week   Drug use: No      ROS Constitutional: Denies fever, chills, weight loss/gain, headaches, insomnia,  night sweats, and change in appetite. Does c/o fatigue. Eyes: Denies redness, blurred vision, diplopia, discharge, itchy, watery eyes.  ENT: Denies discharge, congestion, post nasal drip, epistaxis, sore throat, earache, hearing loss, dental pain, Tinnitus, Vertigo, Sinus pain, snoring.  Cardio: Denies chest pain, palpitations, irregular heartbeat, syncope, dyspnea, diaphoresis, orthopnea, PND, claudication, edema Respiratory: denies cough, dyspnea, DOE, pleurisy, hoarseness, laryngitis, wheezing.  Gastrointestinal: Denies dysphagia, heartburn, reflux, water brash, pain, cramps, nausea, vomiting, bloating, diarrhea, constipation, hematemesis, melena, hematochezia, jaundice, hemorrhoids Genitourinary: Denies dysuria, frequency, urgency, nocturia, hesitancy, discharge, hematuria, flank pain Breast: Breast lumps, nipple discharge, bleeding.  Musculoskeletal: Denies arthralgia, myalgia, stiffness, Jt. Swelling, pain, limp, and strain/sprain. Denies falls. Skin: Denies puritis, rash, hives, warts, acne, eczema, changing in skin lesion Neuro: No weakness, tremor, incoordination, spasms, paresthesia, pain Psychiatric: Denies confusion, memory loss, sensory loss. Denies Depression. Endocrine: Denies change in weight, skin, hair change, nocturia, and paresthesia, diabetic polys, visual blurring, hyper / hypo glycemic episodes.  Heme/Lymph: No excessive bleeding, bruising, enlarged lymph nodes.  Physical Exam  BP 124/78   Pulse 77   Temp 97.9 F (36.6 C)   Resp 16   Ht 5\' 8"  (1.727 m)   Wt 144 lb 6.4 oz (65.5 kg)   SpO2 99%   BMI 21.96 kg/m   General Appearance: Well nourished, well groomed and in no apparent distress.  Eyes: PERRLA, EOMs, conjunctiva no swelling  or erythema, normal fundi and vessels. Sinuses: No frontal/maxillary tenderness ENT/Mouth: EACs patent / TMs  nl. Nares clear without erythema, swelling, mucoid exudates. Oral hygiene is good. No erythema, swelling, or exudate. Tongue normal, non-obstructing. Tonsils not swollen or erythematous. Hearing normal.  Neck: Supple, thyroid not palpable. No bruits, nodes or JVD. Respiratory: Respiratory effort normal.  BS equal and clear bilateral without rales, rhonci, wheezing or stridor. Cardio: Heart sounds are normal with regular rate and rhythm and no murmurs, rubs or gallops. Peripheral pulses are normal and equal bilaterally without edema. No aortic or femoral bruits. Chest: symmetric with normal excursions and percussion. Breasts: Deferred MGM Abdomen: Flat, soft with bowel sounds active. Nontender, no guarding, rebound, hernias, masses, or organomegaly.  Lymphatics: Non tender without lymphadenopathy.  Musculoskeletal: Full ROM all peripheral extremities, joint stability, 5/5 strength, and normal gait. Skin: Warm and dry without rashes, lesions, cyanosis, clubbing or  ecchymosis.  Neuro: Cranial nerves intact, reflexes equal bilaterally. Normal muscle tone, no cerebellar symptoms. Sensation intact.  Pysch: Alert and oriented x 3, normal affect, Insight and Judgment appropriate.    Assessment and Plan  1. Labile hypertension  - EKG 12-Lead - Urinalysis, Routine w reflex microscopic - Microalbumin / creatinine urine ratio - CBC with Differential/Platelet - COMPLETE METABOLIC PANEL WITH GFR - Magnesium - TSH   2. Lipids abnormal  - EKG 12-Lead - Lipid panel - TSH   3. Abnormal glucose  - EKG 12-Lead - Hemoglobin A1c - Insulin, random   4. Vitamin D deficiency  - VITAMIN D 25 Hydroxy    5. Screening for colorectal cancer  - POC Hemoccult Bld/Stl    6. Osteopenia  - TSH   7. Screening for heart disease  - EKG 12-Lead   8. Family history of  hypertension  - EKG 12-Lead   9. Medication management  - Urinalysis, Routine w reflex microscopic - Microalbumin / creatinine urine ratio - CBC with Differential/Platelet - COMPLETE METABOLIC PANEL WITH GFR - Magnesium - Lipid panel - TSH - Hemoglobin A1c - Insulin, random - VITAMIN D 25 Hydroxy           Patient was counseled in prudent diet to achieve/maintain BMI less than 25 for weight control, BP monitoring, regular exercise and medications. Discussed med's effects and SE's. Screening labs and tests as requested with regular follow-up as recommended. Over 40 minutes of exam, counseling, chart review and high complex critical decision making was performed.   Marinus Maw, MD

## 2023-08-10 NOTE — Patient Instructions (Signed)
Due to recent changes in healthcare laws, you may see the results of your imaging and laboratory studies on MyChart before your provider has had a chance to review them.  We understand that in some cases there may be results that are confusing or concerning to you. Not all laboratory results come back in the same time frame and the provider may be waiting for multiple results in order to interpret others.  Please give [place] 48 hours in order for your provider to thoroughly review all the results before contacting the office for clarification of your results.   +++++++++++++++++++++++++  Vit D  & Vit C 1,000 mg   are recommended to help protect  against the Covid-19 and other Corona viruses.    Also it's recommended  to take  Zinc 50 mg x 1/2 tab = 25 mg  / day  to help  protect against the Covid-19   and best place to get  is also on [email or web address]  and don't pay more than 6-8 cents /pill !  ================================ Coronavirus (COVID-19) Are you at risk?  Are you at risk for the Coronavirus (COVID-19)?  To be considered HIGH RISK for Coronavirus (COVID-19), you have to meet the following criteria:  Traveled to [place], [place], [place], [place] or [place]; or in the [place] to [place], [place], [place]  or [place]; and have fever, cough, and shortness of breath within the last 2 weeks of travel OR Been in close contact with a person diagnosed with COVID-19 within the last 2 weeks and have  fever, cough,and shortness of breath  IF YOU DO NOT MEET THESE CRITERIA, YOU ARE CONSIDERED LOW RISK FOR COVID-19.  What to do if you are HIGH RISK for COVID-19?  If you are having a medical emergency, call 911. Seek medical care right away. Before you go to a doctor's office, urgent care or emergency department,  call ahead and tell them about your recent travel, contact with someone diagnosed with COVID-19   and your symptoms.  You should receive instructions from your  physician's office regarding next steps of care.  When you arrive at healthcare provider, tell the healthcare staff immediately you have returned from  visiting [place], [place], [place], [place] or [place]; or traveled in the [place] to [place], [place],  [place] or [place] in the last two weeks or you have been in close contact with a person diagnosed with  COVID-19 in the last 2 weeks.   Tell the health care staff about your symptoms: fever, cough and shortness of breath. After you have been seen by a medical provider, you will be either: Tested for (COVID-19) and discharged home on quarantine except to seek medical care if  symptoms worsen, and asked to  Stay home and avoid contact with others until you get your results (4-5 days)  Avoid travel on public transportation if possible (such as bus, train, or airplane) or Sent to the Emergency Department by EMS for evaluation, COVID-19 testing  and  possible admission depending on your condition and test results.  What to do if you are LOW RISK for COVID-19?  Reduce your risk of any infection by using the same precautions used for avoiding the common cold or flu:  Wash your hands often with soap and warm water for at least 20 seconds.  If soap and water are not readily available,  use an alcohol-based hand sanitizer with at least 60% alcohol.  If coughing  or sneezing, cover your mouth and nose by coughing or sneezing into the elbow areas of your shirt or coat,  into a tissue or into your sleeve (not your hands). Avoid shaking hands with others and consider head nods or verbal greetings only. Avoid touching your eyes, nose, or mouth with unwashed hands.  Avoid close contact with people who are sick. Avoid places or events with large numbers of people in one location, like concerts or sporting events. Carefully consider travel plans you have or are making. If you are planning any travel outside or inside the [place], visit the CDC's  Travelers' Health webpage for the latest health notices. If you have some symptoms but not all symptoms, continue to monitor at home and seek medical attention  if your symptoms worsen. If you are having a medical emergency, call 911.   >>>>>>>>>>>>>>>>>>>>>>>>>>>>>>>>>  We Do NOT Approve of [employer] > > > > > > > > > > > > > > > > > > > > > > > > > > > > > > > > > > > > > > >  Preventive Care for Adults  A healthy lifestyle and preventive care can promote health and wellness. Preventive health guidelines for women include the following key practices. A routine yearly physical is a good way to check with your health care provider about your health and preventive screening. It is a chance to share any concerns and updates on your health and to receive a thorough exam. Visit your dentist for a routine exam and preventive care every 6 months. Brush your teeth twice a day and floss once a day. Good oral hygiene prevents tooth decay and gum disease. The frequency of eye exams is based on your age, health, family medical history, use of contact lenses, and other factors. Follow your health care provider's recommendations for frequency of eye exams. Eat a healthy diet. Foods like vegetables, fruits, whole grains, low-fat dairy products, and lean protein foods contain the nutrients you need without too many calories. Decrease your intake of foods high in solid fats, added sugars, and salt. Eat the right amount of calories for you. Get information about a proper diet from your health care provider, if necessary. Regular physical exercise is one of the most important things you can do for your health. Most adults should get at least 150 minutes of moderate-intensity exercise (any activity that increases your heart rate and causes you to sweat) each week. In addition, most adults need muscle-strengthening exercises on 2 or more days a week. Maintain a healthy weight. The body mass index (BMI) is a  screening tool to identify possible weight problems. It provides an estimate of body fat based on height and weight. Your health care provider can find your BMI and can help you achieve or maintain a healthy weight. For adults 20 years and older: A BMI below 18.5 is considered underweight. A BMI of 18.5 to 24.9 is normal. A BMI of 25 to 29.9 is considered overweight. A BMI of 30 and above is considered obese. Maintain normal blood lipids and cholesterol levels by exercising and minimizing your intake of saturated fat. Eat a balanced diet with plenty of fruit and vegetables. If your lipid or cholesterol levels are high, you are over 50, or you are at high risk for heart disease, you may need your cholesterol levels checked more frequently. Ongoing high lipid and cholesterol levels should be treated with medicines if diet and exercise  are not working. If you smoke, find out from your health care provider how to quit. If you do not use tobacco, do not start. Lung cancer screening is recommended for adults aged 55-80 years who are at high risk for developing lung cancer because of a history of smoking. A yearly low-dose CT scan of the lungs is recommended for people who have at least a 30-pack-year history of smoking and are a current smoker or have quit within the past 15 years. A pack year of smoking is smoking an average of 1 pack of cigarettes a day for 1 year (for example: 1 pack a day for 30 years or 2 packs a day for 15 years). Yearly screening should continue until the smoker has stopped smoking for at least 15 years. Yearly screening should be stopped for people who develop a health problem that would prevent them from having lung cancer treatment. Avoid use of street drugs. Do not share needles with anyone. Ask for help if you need support or instructions about stopping the use of drugs. High blood pressure causes heart disease and increases the risk of stroke.  Ongoing high blood pressure should be  treated with medicines if weight loss and exercise do not work. If you are 73-68 years old, ask your health care provider if you should take aspirin to prevent strokes. Diabetes screening involves taking a blood sample to check your fasting blood sugar level. This should be done once every 3 years, after age 61, if you are within normal weight and without risk factors for diabetes. Testing should be considered at a younger age or be carried out more frequently if you are overweight and have at least 1 risk factor for diabetes. Breast cancer screening is essential preventive care for women. You should practice "breast self-awareness." This means understanding the normal appearance and feel of your breasts and may include breast self-examination. Any changes detected, no matter how small, should be reported to a health care provider. Women in their 81s and 30s should have a clinical breast exam (CBE) by a health care provider as part of a regular health exam every 1 to 3 years. After age 79, women should have a CBE every year. Starting at age 79, women should consider having a mammogram (breast X-ray test) every year. Women who have a family history of breast cancer should talk to their health care provider about genetic screening. Women at a high risk of breast cancer should talk to their health care providers about having an MRI and a mammogram every year. Breast cancer gene (BRCA)-related cancer risk assessment is recommended for women who have family members with BRCA-related cancers. BRCA-related cancers include breast, ovarian, tubal, and peritoneal cancers. Having family members with these cancers may be associated with an increased risk for harmful changes (mutations) in the breast cancer genes BRCA1 and BRCA2. Results of the assessment will determine the need for genetic counseling and BRCA1 and BRCA2 testing. Routine pelvic exams to screen for cancer are no longer recommended for nonpregnant women who  are considered low risk for cancer of the pelvic organs (ovaries, uterus, and vagina) and who do not have symptoms. Ask your health care provider if a screening pelvic exam is right for you. If you have had past treatment for cervical cancer or a condition that could lead to cancer, you need Pap tests and screening for cancer for at least 20 years after your treatment. If Pap tests have been discontinued, your risk factors (such  as having a new sexual partner) need to be reassessed to determine if screening should be resumed. Some women have medical problems that increase the chance of getting cervical cancer. In these cases, your health care provider may recommend more frequent screening and Pap tests.  Colorectal cancer can be detected and often prevented. Most routine colorectal cancer screening begins at the age of 13 years and continues through age 15 years. However, your health care provider may recommend screening at an earlier age if you have risk factors for colon cancer. On a yearly basis, your health care provider may provide home test kits to check for hidden blood in the stool. Use of a small camera at the end of a tube, to directly examine the colon (sigmoidoscopy or colonoscopy), can detect the earliest forms of colorectal cancer. Talk to your health care provider about this at age 36, when routine screening begins.  Direct exam of the colon should be repeated every 5-10 years through age 29 years, unless early forms of pre-cancerous polyps or small growths are found. Osteoporosis is a disease in which the bones lose minerals and strength with aging. This can result in serious bone fractures or breaks. The risk of osteoporosis can be identified using a bone density scan. Women ages 42 years and over and women at risk for fractures or osteoporosis should discuss screening with their health care providers. Ask your health care provider whether you should take a calcium supplement or vitamin D to  reduce the rate of osteoporosis. Menopause can be associated with physical symptoms and risks. Hormone replacement therapy is available to decrease symptoms and risks. You should talk to your health care provider about whether hormone replacement therapy is right for you. Use sunscreen. Apply sunscreen liberally and repeatedly throughout the day. You should seek shade when your shadow is shorter than you. Protect yourself by wearing long sleeves, pants, a wide-brimmed hat, and sunglasses year round, whenever you are outdoors. Once a month, do a whole body skin exam, using a mirror to look at the skin on your back. Tell your health care provider of new moles, moles that have irregular borders, moles that are larger than a pencil eraser, or moles that have changed in shape or color. Stay current with required vaccines (immunizations). Influenza vaccine. All adults should be immunized every year. Tetanus, diphtheria, and acellular pertussis (Td, Tdap) vaccine. Pregnant women should receive 1 dose of Tdap vaccine during each pregnancy. The dose should be obtained regardless of the length of time since the last dose. Immunization is preferred during the 27th-36th week of gestation. An adult who has not previously received Tdap or who does not know her vaccine status should receive 1 dose of Tdap. This initial dose should be followed by tetanus and diphtheria toxoids (Td) booster doses every 10 years. Adults with an unknown or incomplete history of completing a 3-dose immunization series with Td-containing vaccines should begin or complete a primary immunization series including a Tdap dose. Adults should receive a Td booster every 10 years.  Zoster vaccine. One dose is recommended for adults aged 60 years or older unless certain conditions are present.  Pneumococcal 13-valent conjugate (PCV13) vaccine. When indicated, a person who is uncertain of her immunization history and has no record of immunization should  receive the PCV13 vaccine. An adult aged 54 years or older who has certain medical conditions and has not been previously immunized should receive 1 dose of PCV13 vaccine. This PCV13 should be followed with  a dose of pneumococcal polysaccharide (PPSV23) vaccine. The PPSV23 vaccine dose should be obtained at least 1 or more year(s) after the dose of PCV13 vaccine. An adult aged 58 years or older who has certain medical conditions and previously received 1 or more doses of PPSV23 vaccine should receive 1 dose of PCV13. The PCV13 vaccine dose should be obtained 1 or more years after the last PPSV23 vaccine dose.  Pneumococcal polysaccharide (PPSV23) vaccine. When PCV13 is also indicated, PCV13 should be obtained first. All adults aged 74 years and older should be immunized. An adult younger than age 68 years who has certain medical conditions should be immunized. Any person who resides in a nursing home or long-term care facility should be immunized. An adult smoker should be immunized. People with an immunocompromised condition and certain other conditions should receive both PCV13 and PPSV23 vaccines. People with human immunodeficiency virus (HIV) infection should be immunized as soon as possible after diagnosis. Immunization during chemotherapy or radiation therapy should be avoided. Routine use of PPSV23 vaccine is not recommended for American Indians, [address], or people younger than 65 years unless there are medical conditions that require PPSV23 vaccine. When indicated, people who have unknown immunization and have no record of immunization should receive PPSV23 vaccine. One-time revaccination 5 years after the first dose of PPSV23 is recommended for people aged 19-64 years who have chronic kidney failure, nephrotic syndrome, asplenia, or immunocompromised conditions. People who received 1-2 doses of PPSV23 before age 94 years should receive another dose of PPSV23 vaccine at age 107 years or later if at  least 5 years have passed since the previous dose. Doses of PPSV23 are not needed for people immunized with PPSV23 at or after age 35 years.  Preventive Services / Frequency  Ages 65 years and over Blood pressure check. Lipid and cholesterol check. Lung cancer screening. / Every year if you are aged 55-80 years and have a 30-pack-year history of smoking and currently smoke or have quit within the past 15 years. Yearly screening is stopped once you have quit smoking for at least 15 years or develop a health problem that would prevent you from having lung cancer treatment. Clinical breast exam.** / Every year after age 69 years.  BRCA-related cancer risk assessment.** / For women who have family members with a BRCA-related cancer (breast, ovarian, tubal, or peritoneal cancers). Mammogram.** / Every year beginning at age 13 years and continuing for as long as you are in good health. Consult with your health care provider. Pap test.** / Every 3 years starting at age 68 years through age 71 or 63 years with 3 consecutive normal Pap tests. Testing can be stopped between 65 and 70 years with 3 consecutive normal Pap tests and no abnormal Pap or HPV tests in the past 10 years. Fecal occult blood test (FOBT) of stool. / Every year beginning at age 71 years and continuing until age 62 years. You may not need to do this test if you get a colonoscopy every 10 years. Flexible sigmoidoscopy or colonoscopy.** / Every 5 years for a flexible sigmoidoscopy or every 10 years for a colonoscopy beginning at age 54 years and continuing until age 4 years. Hepatitis C blood test.** / For all people born from [birth year] through 1963 and any individual with known risks for hepatitis C. Osteoporosis screening.** / A one-time screening for women ages 38 years and over and women at risk for fractures or osteoporosis. Skin self-exam. / Monthly. Influenza vaccine. /  Every year. Tetanus, diphtheria, and acellular pertussis (Tdap/Td)  vaccine.** / 1 dose of Td every 10 years. Zoster vaccine.** / 1 dose for adults aged 30 years or older. Pneumococcal 13-valent conjugate (PCV13) vaccine.** / Consult your health care provider. Pneumococcal polysaccharide (PPSV23) vaccine.** / 1 dose for all adults aged 59 years and older. Screening for abdominal aortic aneurysm (AAA)  by ultrasound is recommended for people who have history of high blood pressure or who are current or former smokers. ++++++++++++++++++++ Recommend Adult Low Dose Aspirin or  coated  Aspirin 81 mg daily  To reduce risk of Colon Cancer 40 %,  Skin Cancer 26 % ,  Melanoma 46%  and  Pancreatic cancer 60% ++++++++++++++++++++ Vitamin D goal  is between 70-100.  Please make sure that you are taking your Vitamin D as directed.  It is very important as a natural anti-inflammatory  helping hair, skin, and nails, as well as reducing stroke and heart attack risk.  It helps your bones and helps with mood. It also decreases numerous cancer risks so please take it as directed.  Low Vit D is associated with a 200-300% higher risk for CANCER  and 200-300% higher risk for HEART   ATTACK  &  STROKE.   .....................................Marland Kitchen It is also associated with higher death rate at younger ages,  autoimmune diseases like Rheumatoid arthritis, Lupus, Multiple Sclerosis.    Also many other serious conditions, like depression, Alzheimer's Dementia, infertility, muscle aches, fatigue, fibromyalgia - just to name a few. ++++++++++++++++++ Recommend the book "The END of DIETING" by Dr Monico Hoar  & the book "The END of DIABETES " by Dr Monico Hoar At [email or web address] - get book & Audio CD's    Being diabetic has a  300% increased risk for heart attack, stroke, cancer, and alzheimer- type vascular dementia. It is very important that you work harder with diet by avoiding all foods that are white. Avoid white rice (brown & wild rice is OK), white potatoes (sweetpotatoes in  moderation is OK), White bread or wheat bread or anything made out of white flour like bagels, donuts, rolls, buns, biscuits, cakes, pastries, cookies, pizza crust, and pasta (made from white flour & egg whites) - vegetarian pasta or spinach or wheat pasta is OK. Multigrain breads like Arnold's or [employer], or multigrain sandwich thins or flatbreads.  Diet, exercise and weight loss can reverse and cure diabetes in the early stages.  Diet, exercise and weight loss is very important in the control and prevention of complications of diabetes which affects every system in your body, ie. Brain - dementia/stroke, eyes - glaucoma/blindness, heart - heart attack/heart failure, kidneys - dialysis, stomach - gastric paralysis, intestines - malabsorption, nerves - severe painful neuritis, circulation - gangrene & loss of a leg(s), and finally cancer and Alzheimers.    I recommend avoid fried & greasy foods,  sweets/candy, white rice (brown or wild rice or Quinoa is OK), white potatoes (sweet potatoes are OK) - anything made from white flour - bagels, doughnuts, rolls, buns, biscuits,white and wheat breads, pizza crust and traditional pasta made of white flour & egg white(vegetarian pasta or spinach or wheat pasta is OK).  Multi-grain bread is OK - like multi-grain flat bread or sandwich thins. Avoid alcohol in excess. Exercise is also important.    Eat all the vegetables you want - avoid meat, especially red meat and dairy - especially cheese.  Cheese is the most concentrated form of trans-fats which is the worst  thing to clog up our arteries. Veggie cheese is OK which can be found in the fresh produce section at [employer] or [employer] or [employer]  +++++++++++++++++++ DASH Eating Plan  DASH stands for "Dietary Approaches to Stop Hypertension."   The DASH eating plan is a healthy eating plan that has been shown to reduce high blood pressure (hypertension). Additional health benefits may include reducing  the risk of type 2 diabetes mellitus, heart disease, and stroke. The DASH eating plan may also help with weight loss. WHAT DO I NEED TO KNOW ABOUT THE DASH EATING PLAN? For the DASH eating plan, you will follow these general guidelines: Choose foods with a percent daily value for sodium of less than 5% (as listed on the food label). Use salt-free seasonings or herbs instead of table salt or sea salt. Check with your health care provider or pharmacist before using salt substitutes. Eat lower-sodium products, often labeled as "lower sodium" or "no salt added." Eat fresh foods. Eat more vegetables, fruits, and low-fat dairy products. Choose whole grains. Look for the word "whole" as the first word in the ingredient list. Choose fish  Limit sweets, desserts, sugars, and sugary drinks. Choose heart-healthy fats. Eat veggie cheese  Eat more home-cooked food and less restaurant, buffet, and fast food. Limit fried foods. Cook foods using methods other than frying. Limit canned vegetables. If you do use them, rinse them well to decrease the sodium. When eating at a restaurant, ask that your food be prepared with less salt, or no salt if possible.                      WHAT FOODS CAN I EAT? Read Dr Francis Dowse Fuhrman's books on The End of Dieting & The End of Diabetes  Grains Whole grain or whole wheat bread. Brown rice. Whole grain or whole wheat pasta. Quinoa, bulgur, and whole grain cereals. Low-sodium cereals. Corn or whole wheat flour tortillas. Whole grain cornbread. Whole grain crackers. Low-sodium crackers.  Vegetables Fresh or frozen vegetables (raw, steamed, roasted, or grilled). Low-sodium or reduced-sodium tomato and vegetable juices. Low-sodium or reduced-sodium tomato sauce and paste. Low-sodium or reduced-sodium canned vegetables.   Fruits All fresh, canned (in natural juice), or frozen fruits.  Protein Products  All fish and seafood.  Dried beans, peas, or lentils. Unsalted nuts and  seeds. Unsalted canned beans.  Dairy Low-fat dairy products, such as skim or 1% milk, 2% or reduced-fat cheeses, low-fat ricotta or cottage cheese, or plain low-fat yogurt. Low-sodium or reduced-sodium cheeses.  Fats and Oils Tub margarines without trans fats. Light or reduced-fat mayonnaise and salad dressings (reduced sodium). Avocado. Safflower, olive, or canola oils. Natural peanut or almond butter.  Other Unsalted popcorn and pretzels. The items listed above may not be a complete list of recommended foods or beverages. Contact your dietitian for more options.  +++++++++++++++  WHAT FOODS ARE NOT RECOMMENDED? Grains/ White flour or wheat flour White bread. White pasta. White rice. Refined cornbread. Bagels and croissants. Crackers that contain trans fat.  Vegetables  Creamed or fried vegetables. Vegetables in a . Regular canned vegetables. Regular canned tomato sauce and paste. Regular tomato and vegetable juices.  Fruits Dried fruits. Canned fruit in light or heavy syrup. Fruit juice.  Meat and Other Protein Products Meat in general - RED meat & White meat.  Fatty cuts of meat. Ribs, chicken wings, all processed meats as bacon, sausage, bologna, salami, fatback, hot dogs, bratwurst and packaged luncheon meats.  Dairy Whole or 2% milk, cream, half-and-half, and cream cheese. Whole-fat or sweetened yogurt. Full-fat cheeses or blue cheese. Non-dairy creamers and whipped toppings. Processed cheese, cheese spreads, or cheese curds.  Condiments Onion and garlic salt, seasoned salt, table salt, and sea salt. Canned and packaged gravies. Worcestershire sauce. Tartar sauce. Barbecue sauce. Teriyaki sauce. Soy sauce, including reduced sodium. Steak sauce. Fish sauce. Oyster sauce. Cocktail sauce. Horseradish. Ketchup and mustard. Meat flavorings and tenderizers. Bouillon cubes. Hot sauce. Tabasco sauce. Marinades. Taco seasonings. Relishes.  Fats and Oils Butter, stick margarine, lard,  shortening and bacon fat. Coconut, palm kernel, or palm oils. Regular salad dressings.  Pickles and olives. Salted popcorn and pretzels.  The items listed above may not be a complete list of foods and beverages to avoid.

## 2023-08-11 ENCOUNTER — Encounter: Payer: Self-pay | Admitting: Internal Medicine

## 2023-08-11 ENCOUNTER — Ambulatory Visit (INDEPENDENT_AMBULATORY_CARE_PROVIDER_SITE_OTHER): Payer: Medicare Other | Admitting: Internal Medicine

## 2023-08-11 VITALS — BP 124/78 | HR 77 | Temp 97.9°F | Resp 16 | Ht 68.0 in | Wt 144.4 lb

## 2023-08-11 DIAGNOSIS — Z136 Encounter for screening for cardiovascular disorders: Secondary | ICD-10-CM | POA: Diagnosis not present

## 2023-08-11 DIAGNOSIS — R7309 Other abnormal glucose: Secondary | ICD-10-CM

## 2023-08-11 DIAGNOSIS — Z8249 Family history of ischemic heart disease and other diseases of the circulatory system: Secondary | ICD-10-CM

## 2023-08-11 DIAGNOSIS — B351 Tinea unguium: Secondary | ICD-10-CM

## 2023-08-11 DIAGNOSIS — R0989 Other specified symptoms and signs involving the circulatory and respiratory systems: Secondary | ICD-10-CM

## 2023-08-11 DIAGNOSIS — M8580 Other specified disorders of bone density and structure, unspecified site: Secondary | ICD-10-CM

## 2023-08-11 DIAGNOSIS — E7889 Other lipoprotein metabolism disorders: Secondary | ICD-10-CM | POA: Diagnosis not present

## 2023-08-11 DIAGNOSIS — Z1211 Encounter for screening for malignant neoplasm of colon: Secondary | ICD-10-CM

## 2023-08-11 DIAGNOSIS — E559 Vitamin D deficiency, unspecified: Secondary | ICD-10-CM

## 2023-08-11 DIAGNOSIS — Z79899 Other long term (current) drug therapy: Secondary | ICD-10-CM

## 2023-08-11 MED ORDER — TERBINAFINE HCL 250 MG PO TABS: 250.0000 mg | ORAL_TABLET | Freq: Every day | ORAL | 1 refills | Status: DC

## 2023-08-11 MED ORDER — VITAMIN K2 100 MCG PO TABS: ORAL_TABLET | ORAL | Status: AC

## 2023-08-12 LAB — URINALYSIS, ROUTINE W REFLEX MICROSCOPIC
Bilirubin Urine: NEGATIVE
Glucose, UA: NEGATIVE
Hgb urine dipstick: NEGATIVE
Ketones, ur: NEGATIVE
Leukocytes,Ua: NEGATIVE
Nitrite: NEGATIVE
Protein, ur: NEGATIVE
Specific Gravity, Urine: 1.02 (ref 1.001–1.035)
pH: 5.5 (ref 5.0–8.0)

## 2023-08-12 LAB — LIPID PANEL
Cholesterol: 199 mg/dL (ref <200)
HDL: 124 mg/dL (ref >=50)
LDL Cholesterol (Calc): 59 mg/dL
Non-HDL Cholesterol (Calc): 75 mg/dL (ref <130)
Total CHOL/HDL Ratio: 1.6 (calc) (ref <5.0)
Triglycerides: 75 mg/dL (ref <150)

## 2023-08-12 LAB — CBC WITH DIFFERENTIAL/PLATELET
Absolute Lymphocytes: 1102 {cells}/uL (ref 850–3900)
Absolute Monocytes: 451 {cells}/uL (ref 200–950)
Basophils Absolute: 42 {cells}/uL (ref 0–200)
Basophils Relative: 0.8 %
Eosinophils Absolute: 69 {cells}/uL (ref 15–500)
Eosinophils Relative: 1.3 %
HCT: 40 % (ref 35.0–45.0)
Hemoglobin: 13.3 g/dL (ref 11.7–15.5)
MCH: 31.7 pg (ref 27.0–33.0)
MCHC: 33.3 g/dL (ref 32.0–36.0)
MCV: 95.5 fL (ref 80.0–100.0)
MPV: 12.1 fL (ref 7.5–12.5)
Monocytes Relative: 8.5 %
Neutro Abs: 3636 {cells}/uL (ref 1500–7800)
Neutrophils Relative %: 68.6 %
Platelets: 148 10*3/uL (ref 140–400)
RBC: 4.19 10*6/uL (ref 3.80–5.10)
RDW: 11.8 % (ref 11.0–15.0)
Total Lymphocyte: 20.8 %
WBC: 5.3 10*3/uL (ref 3.8–10.8)

## 2023-08-12 LAB — MAGNESIUM: Magnesium: 2.1 mg/dL (ref 1.5–2.5)

## 2023-08-12 LAB — COMPLETE METABOLIC PANEL WITH GFR
AG Ratio: 1.7 (calc) (ref 1.0–2.5)
ALT: 17 U/L (ref 6–29)
AST: 18 U/L (ref 10–35)
Albumin: 4.2 g/dL (ref 3.6–5.1)
Alkaline phosphatase (APISO): 50 U/L (ref 37–153)
BUN: 20 mg/dL (ref 7–25)
CO2: 27 mmol/L (ref 20–32)
Calcium: 10 mg/dL (ref 8.6–10.4)
Chloride: 105 mmol/L (ref 98–110)
Creat: 1.05 mg/dL (ref 0.50–1.05)
Globulin: 2.5 g/dL (ref 1.9–3.7)
Glucose, Bld: 109 mg/dL — ABNORMAL HIGH (ref 65–99)
Potassium: 4.4 mmol/L (ref 3.5–5.3)
Sodium: 142 mmol/L (ref 135–146)
Total Bilirubin: 0.4 mg/dL (ref 0.2–1.2)
Total Protein: 6.7 g/dL (ref 6.1–8.1)
eGFR: 58 mL/min/{1.73_m2} — ABNORMAL LOW (ref >=60)

## 2023-08-12 LAB — MICROALBUMIN / CREATININE URINE RATIO
Creatinine, Urine: 94 mg/dL (ref 20–275)
Microalb Creat Ratio: 4 mg/g{creat} (ref <30)
Microalb, Ur: 0.4 mg/dL

## 2023-08-12 LAB — VITAMIN D 25 HYDROXY (VIT D DEFICIENCY, FRACTURES): Vit D, 25-Hydroxy: 76 ng/mL (ref 30–100)

## 2023-08-12 LAB — TSH: TSH: 0.83 m[IU]/L (ref 0.40–4.50)

## 2023-08-12 LAB — HEMOGLOBIN A1C
Hgb A1c MFr Bld: 5.5 %{Hb} (ref <5.7)
Mean Plasma Glucose: 111 mg/dL
eAG (mmol/L): 6.2 mmol/L

## 2023-08-12 LAB — INSULIN, RANDOM: Insulin: 8.8 u[IU]/mL

## 2023-08-12 NOTE — Progress Notes (Signed)
[][][][][][][][][][][][][][][][][][][][][][][][][][][][][][][][][][][][][][][][][]][][][][][][][][][][][][][][][][][][][][][][][[][][][][] [][][][][][][][][][][][][][][][][][][][][][][][][][][][][][][][][][][][][][][][][]][][][][][][][][][][][][][][][][][][][][][][][[][][][][] -  Test results slightly outside the reference range are not unusual. If there is anything important, I will review this with you,  otherwise it is considered normal test values.  If you have further questions,  please do not hesitate to contact me at the office or via My Chart.  [] [] [] [] [] [] [] [] [] [] [] [] [] [] [] [] [] [] [] [] [] [] [] [] [] [] [] [] [] [] [] [] [] [] [] [] [] [] [] [] [] ][] [] [] [] [] [] [] [] [] [] [] [] [] [] [] [] [] [] [] [] [] [] [[] [] [] [] []  [] [] [] [] [] [] [] [] [] [] [] [] [] [] [] [] [] [] [] [] [] [] [] [] [] [] [] [] [] [] [] [] [] [] [] [] [] [] [] [] [] ][] [] [] [] [] [] [] [] [] [] [] [] [] [] [] [] [] [] [] [] [] [] [[] [] [] [] []   -  A1c - Normal - No Diabetes  - Great   !   [] [] [] [] [] [] [] [] [] [] [] [] [] [] [] [] [] [] [] [] [] [] [] [] [] [] [] [] [] [] [] [] [] [] [] [] [] [] [] [] [] ][] [] [] [] [] [] [] [] [] [] [] [] [] [] [] [] [] [] [] [] [] [] [[] [] [] [] []   -  Vitamin D = 76 - Excellent  - Please keep dosage same   [] [] [] [] [] [] [] [] [] [] [] [] [] [] [] [] [] [] [] [] [] [] [] [] [] [] [] [] [] [] [] [] [] [] [] [] [] [] [] [] [] ][] [] [] [] [] [] [] [] [] [] [] [] [] [] [] [] [] [] [] [] [] [] [[] [] [] [] []   - Chol = 199   &   LDL = 59 - Both Great  &  - the Good "protective"HDL Cholesterol is very high - Excellent   - Very low risk for Heart Attack  / Stroke  [] [] [] [] [] [] [] [] [] [] [] [] [] [] [] [] [] [] [] [] [] [] [] [] [] [] [] [] [] [] [] [] [] [] [] [] [] [] [] [] [] ][] [] [] [] [] [] [] [] [] [] [] [] [] [] [] [] [] [] [] [] [] [] [[] [] [] [] []   - All Else - CBC - Kidneys - Electrolytes - Liver - Magnesium & Thyroid    - all  Normal / OK  [] [] [] [] [] [] [] [] [] [] [] [] [] [] [] [] [] [] [] [] [] [] [] [] [] [] [] [] [] [] [] [] [] [] [] [] [] [] [] [] [] ][] [] [] [] [] [] [] [] [] [] [] [] [] [] [] [] [] [] [] [] [] [] [[] [] [] [] []   - Keep up the [place] Work  !    [] [] [] [] [] [] [] [] [] [] [] [] [] [] [] [] [] [] [] [] [] [] [] [] [] [] [] [] [] [] [] [] [] [] [] [] [] [] [] [] [] ][] [] [] [] [] [] [] [] [] [] [] [] [] [] [] [] [] [] [] [] [] [] [[] [] [] [] []

## 2023-10-22 ENCOUNTER — Telehealth: Payer: Self-pay

## 2023-10-22 ENCOUNTER — Other Ambulatory Visit: Payer: Self-pay | Admitting: Nurse Practitioner

## 2023-10-22 DIAGNOSIS — M810 Age-related osteoporosis without current pathological fracture: Secondary | ICD-10-CM

## 2023-10-22 MED ORDER — ALENDRONATE SODIUM 70 MG PO TABS: 70.0000 mg | ORAL_TABLET | ORAL | 11 refills | Status: AC

## 2023-10-22 NOTE — Telephone Encounter (Signed)
 Refill request for Alendronate  and Terbinafine .  [facility] [address]

## 2023-10-28 ENCOUNTER — Ambulatory Visit: Payer: Medicare Other | Admitting: Family

## 2023-10-28 ENCOUNTER — Ambulatory Visit: Payer: Medicare Other

## 2023-10-28 ENCOUNTER — Encounter: Payer: Self-pay | Admitting: Family

## 2023-10-28 VITALS — BP 128/84 | HR 70 | Temp 97.6°F | Ht 68.0 in

## 2023-10-28 DIAGNOSIS — B351 Tinea unguium: Secondary | ICD-10-CM

## 2023-10-28 DIAGNOSIS — Z79899 Other long term (current) drug therapy: Secondary | ICD-10-CM

## 2023-10-28 LAB — HEPATIC FUNCTION PANEL
ALT: 21 U/L (ref 0–35)
AST: 24 U/L (ref 0–37)
Albumin: 4.4 g/dL (ref 3.5–5.2)
Alkaline Phosphatase: 51 U/L (ref 39–117)
Bilirubin, Direct: 0.1 mg/dL (ref 0.0–0.3)
Total Bilirubin: 0.4 mg/dL (ref 0.2–1.2)
Total Protein: 7.2 g/dL (ref 6.0–8.3)

## 2023-10-28 MED ORDER — TERBINAFINE HCL 250 MG PO TABS: 250.0000 mg | ORAL_TABLET | Freq: Every day | ORAL | 1 refills | Status: AC

## 2023-10-28 NOTE — Progress Notes (Signed)
Acute Office Visit  Subjective:     Patient ID: Cynthia Lin, female    DOB: 25-May-1951, 73 y.o.   MRN: [id number]  Chief Complaint  Patient presents with  . Medication Refill    Would like to get labs for liver function so that she can get refill of Lamisil    HPI Patient is in today to request a refill on Lamisil for toenail fungus. She is a former patient of Dr. Astrid Divine. She reports she was instructed to take the medication 3 months on, 3 months off with liver enzymes being checked in between. She is tolerating the Lamisil well. She reports the fungus is improving but it has not resolved.  Review of Systems  Skin:        Toenail fungus  All other systems reviewed and are negative. Past Medical History:  Diagnosis Date  . Essential thrombocytosis (HCC)   . Fibrocystic breast disease   . Fibroids    Uterine  . Hypertension   . Migraines   . Osteopenia     Social History   Socioeconomic History  . Marital status: Married    Spouse name: Not on file  . Number of children: Not on file  . Years of education: Not on file  . Highest education level: Not on file  Occupational History  . Not on file  Tobacco Use  . Smoking status: Never  . Smokeless tobacco: Never  Vaping Use  . Vaping status: Never Used  Substance and Sexual Activity  . Alcohol use: Yes    Alcohol/week: 5.0 standard drinks of alcohol    Types: 5 Glasses of wine per week  . Drug use: No  . Sexual activity: Yes    Partners: Male    Birth control/protection: Post-menopausal  Other Topics Concern  . Not on file  Social History Narrative  . Not on file   Social Drivers of Health   Financial Resource Strain: Not on file  Food Insecurity: Not on file  Transportation Needs: Not on file  Physical Activity: Not on file  Stress: Not on file  Social Connections: Not on file  Intimate Partner Violence: Not on file    Past Surgical History:  Procedure Laterality Date  . BREAST SURGERY      Left breast biopsy (benign)    Family History  Problem Relation Age of Onset  . Breast cancer Mother        Breast  . Atrial fibrillation Father   . HIV Brother     Allergies  Allergen Reactions  . Other     Cigarette smoke    Current Outpatient Medications on File Prior to Visit  Medication Sig Dispense Refill  . alendronate (FOSAMAX) 70 MG tablet Take 1 tablet (70 mg total) by mouth once a week. Take with a full glass of water on an empty stomach. 4 tablet 11  . Ascorbic Acid (VITAMIN C) 1000 MG tablet Take 1,000 mg by mouth daily.     . Cholecalciferol (VITAMIN D) 125 MCG (5000 UT) CAPS Take by mouth.    . Menatetrenone (VITAMIN K2) 100 MCG TABS Takes 1 tablet Daily    . Multiple Vitamin (MULTIVITAMIN) capsule Take 1 capsule by mouth daily.       No current facility-administered medications on file prior to visit.    BP 128/84 (BP Location: Left Arm, Patient Position: Sitting)   Pulse 70   Temp 97.6 F (36.4 C) (Temporal)   Ht 5\' 8"  (  1.727 m)   SpO2 98%   BMI 21.96 kg/m chart      Objective:    BP 128/84 (BP Location: Left Arm, Patient Position: Sitting)   Pulse 70   Temp 97.6 F (36.4 C) (Temporal)   Ht 5\' 8"  (1.727 m)   SpO2 98%   BMI 21.96 kg/m    Physical Exam Vitals reviewed.  Constitutional:      Appearance: Normal appearance. She is normal weight.  Cardiovascular:     Rate and Rhythm: Normal rate and regular rhythm.     Pulses: Normal pulses.     Heart sounds: Normal heart sounds.  Pulmonary:     Effort: Pulmonary effort is normal.     Breath sounds: Normal breath sounds.  Abdominal:     General: Abdomen is flat. Bowel sounds are normal.     Palpations: Abdomen is soft.     Tenderness: There is no abdominal tenderness. There is no guarding or rebound.  Skin:    General: Skin is warm and dry.  Neurological:     General: No focal deficit present.     Mental Status: She is alert and oriented to person, place, and time. Mental status is at  baseline.  Psychiatric:        Mood and Affect: Mood normal.        Behavior: Behavior normal.   No results found for any visits on 10/28/23.      Assessment & Plan:   Problem List Items Addressed This Visit     Medication management   Relevant Orders   Hepatic function panel   Other Visit Diagnoses       Onychomycosis    -  Primary   Relevant Medications   terbinafine (LAMISIL) 250 MG tablet   Other Relevant Orders   Hepatic function panel       Meds ordered this encounter  Medications  . terbinafine (LAMISIL) 250 MG tablet    Sig: Take 1 tablet (250 mg total) by mouth daily. Take 1 pill daily for one month, off for 1 month, one a month, etc until gone    Dispense:  90 tablet    Refill:  1    Supervising Provider:   Bradd Canary [[id number]]   Call the office with questions or concerns. Establish a PCP asap. List provided. Lamisil refilled.  No follow-ups on file.  Eulis Foster, FNP

## 2023-11-12 LAB — HM MAMMOGRAPHY

## 2023-12-12 ENCOUNTER — Encounter: Payer: Self-pay | Admitting: Internal Medicine

## 2024-02-25 ENCOUNTER — Ambulatory Visit: Payer: Medicare Other | Admitting: Nurse Practitioner

## 2024-08-17 ENCOUNTER — Encounter: Payer: Medicare Other | Admitting: Internal Medicine
